# Patient Record
Sex: Female | Born: 1975 | Race: White | Hispanic: No | Marital: Single | Smoking: Never smoker
Health system: Southern US, Community
[De-identification: ages and names within clinical notes are randomized; demographics above are authoritative.]

## PROBLEM LIST (undated history)

## (undated) DIAGNOSIS — K589 Irritable bowel syndrome without diarrhea: Secondary | ICD-10-CM

## (undated) DIAGNOSIS — S0300XA Dislocation of jaw, unspecified side, initial encounter: Secondary | ICD-10-CM

## (undated) DIAGNOSIS — M654 Radial styloid tenosynovitis [de Quervain]: Secondary | ICD-10-CM

## (undated) DIAGNOSIS — J45909 Unspecified asthma, uncomplicated: Secondary | ICD-10-CM

## (undated) DIAGNOSIS — F32A Depression, unspecified: Secondary | ICD-10-CM

## (undated) DIAGNOSIS — F329 Major depressive disorder, single episode, unspecified: Secondary | ICD-10-CM

## (undated) DIAGNOSIS — F419 Anxiety disorder, unspecified: Secondary | ICD-10-CM

## (undated) DIAGNOSIS — D649 Anemia, unspecified: Secondary | ICD-10-CM

## (undated) HISTORY — DX: Depression, unspecified: F32.A

## (undated) HISTORY — DX: Anemia, unspecified: D64.9

## (undated) HISTORY — DX: Irritable bowel syndrome, unspecified: K58.9

## (undated) HISTORY — DX: Dislocation of jaw, unspecified side, initial encounter: S03.00XA

## (undated) HISTORY — DX: Major depressive disorder, single episode, unspecified: F32.9

## (undated) HISTORY — DX: Radial styloid tenosynovitis (de quervain): M65.4

## (undated) HISTORY — DX: Anxiety disorder, unspecified: F41.9

## (undated) HISTORY — DX: Unspecified asthma, uncomplicated: J45.909

---

## 2009-10-23 ENCOUNTER — Ambulatory Visit (HOSPITAL_COMMUNITY): Admission: RE | Admit: 2009-10-23 | Discharge: 2009-10-23 | Payer: Self-pay | Admitting: Family Medicine

## 2009-11-05 ENCOUNTER — Emergency Department (HOSPITAL_COMMUNITY): Admission: EM | Admit: 2009-11-05 | Discharge: 2009-11-06 | Payer: Self-pay | Admitting: Emergency Medicine

## 2009-11-06 ENCOUNTER — Encounter (INDEPENDENT_AMBULATORY_CARE_PROVIDER_SITE_OTHER): Payer: Self-pay | Admitting: Emergency Medicine

## 2009-11-06 ENCOUNTER — Ambulatory Visit: Payer: Self-pay | Admitting: Vascular Surgery

## 2009-11-06 ENCOUNTER — Ambulatory Visit (HOSPITAL_COMMUNITY): Admission: RE | Admit: 2009-11-06 | Discharge: 2009-11-06 | Payer: Self-pay | Admitting: Emergency Medicine

## 2010-02-08 ENCOUNTER — Encounter: Payer: Self-pay | Admitting: Family Medicine

## 2014-04-20 ENCOUNTER — Other Ambulatory Visit: Payer: Self-pay | Admitting: Otolaryngology

## 2014-04-20 DIAGNOSIS — H9191 Unspecified hearing loss, right ear: Secondary | ICD-10-CM

## 2014-04-25 ENCOUNTER — Ambulatory Visit
Admission: RE | Admit: 2014-04-25 | Discharge: 2014-04-25 | Disposition: A | Discharge status: Home / Self Care | Payer: BLUE CROSS/BLUE SHIELD | Source: Ambulatory Visit | Attending: Otolaryngology | Admitting: Otolaryngology

## 2014-04-25 DIAGNOSIS — H9191 Unspecified hearing loss, right ear: Secondary | ICD-10-CM

## 2014-04-25 MED ORDER — GADOBENATE DIMEGLUMINE 529 MG/ML IV SOLN
12.0000 mL | Freq: Once | INTRAVENOUS | Status: AC | PRN
  Administered 2014-04-25: 12 mL via INTRAVENOUS

## 2015-03-24 DIAGNOSIS — F419 Anxiety disorder, unspecified: Secondary | ICD-10-CM | POA: Insufficient documentation

## 2015-03-24 DIAGNOSIS — K589 Irritable bowel syndrome without diarrhea: Secondary | ICD-10-CM | POA: Insufficient documentation

## 2015-03-24 DIAGNOSIS — M654 Radial styloid tenosynovitis [de Quervain]: Secondary | ICD-10-CM | POA: Insufficient documentation

## 2015-03-24 DIAGNOSIS — F339 Major depressive disorder, recurrent, unspecified: Secondary | ICD-10-CM | POA: Insufficient documentation

## 2015-03-24 DIAGNOSIS — J45909 Unspecified asthma, uncomplicated: Secondary | ICD-10-CM | POA: Insufficient documentation

## 2015-03-24 DIAGNOSIS — G4700 Insomnia, unspecified: Secondary | ICD-10-CM | POA: Insufficient documentation

## 2015-03-25 ENCOUNTER — Encounter: Payer: Self-pay | Admitting: Internal Medicine

## 2015-03-27 ENCOUNTER — Encounter: Payer: Self-pay | Admitting: Internal Medicine

## 2015-03-27 ENCOUNTER — Ambulatory Visit (INDEPENDENT_AMBULATORY_CARE_PROVIDER_SITE_OTHER): Payer: BLUE CROSS/BLUE SHIELD | Admitting: Internal Medicine

## 2015-03-27 VITALS — BP 126/60 | HR 62 | Ht 65.0 in | Wt 132.0 lb

## 2015-03-27 DIAGNOSIS — K602 Anal fissure, unspecified: Secondary | ICD-10-CM | POA: Diagnosis not present

## 2015-03-27 DIAGNOSIS — K625 Hemorrhage of anus and rectum: Secondary | ICD-10-CM | POA: Diagnosis not present

## 2015-03-27 DIAGNOSIS — K6289 Other specified diseases of anus and rectum: Secondary | ICD-10-CM

## 2015-03-27 MED ORDER — DILTIAZEM GEL 2 %: 1.0000 "application " | Freq: Three times a day (TID) | CUTANEOUS | Status: DC

## 2015-03-27 NOTE — Progress Notes (Signed)
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 Vonna KotykPasquele for evaluation of rectal pain and rectal bleeding. The patient reports being in her usual state of good health until late January when after passing a hard bowel movement noticed rectal bleeding. This persisted with most bowel movements and was associated with rectal discomfort. More severe pain last week. She describes the discomfort as stinging or tearing. Historically she describes her bowels as generally on the loose side. Occasional constipation when traveling or under stress. She has been using apple cider vinegar which regulates her bowel habits. She denies weight loss, fever, abdominal pain, or family history of Crohn's disease. She does tell me that she had painless [facility] [place] [facility] for which she  underwent an unremarkable colonoscopy. GI review of systems is otherwise negative.  REVIEW OF SYSTEMS:  All non-GI ROS negative except for back pain, hearing problems  Past Medical History  Diagnosis Date  . Anxiety   . Anemia   . Asthma   . TMJ (dislocation of temporomandibular joint)   . IBS (irritable bowel syndrome)   . Depression   . De Quervain's disease (radial styloid tenosynovitis)     History reviewed. No pertinent past surgical history.  Social History Kendra Berger  reports that she has never smoked. She has never used smokeless tobacco. She reports that she drinks about 0.6 oz of alcohol per week. Her drug history is not on file.  family history includes Bladder Cancer in her maternal grandmother; Diabetes in her father.  Allergies  Allergen Reactions  . Prednisone        PHYSICAL EXAMINATION: Vital signs: BP 126/60 mmHg  Pulse 62  Ht 5\' 5"  (1.651 m)  Wt 132 lb (59.875 kg)  BMI 21.97 kg/m2  LMP 03/21/2015 General: Well-developed, well-nourished, no acute distress HEENT: Sclerae are anicteric, conjunctiva pink. Oral  mucosa intact Lungs: Clear Heart: Regular Abdomen: soft, nontender, nondistended, no obvious ascites, no peritoneal signs, normal bowel sounds. No organomegaly. Rectal: No external hemorrhoids. Tender left lateral fissure without heme Extremities: NoClubbing cyanosis or edema Psychiatric: alert and oriented x3. Cooperative   ASSESSMENT:  #1. Asymptomatic anal fissure   PLAN:  #1. Sitz baths daily #2. Daily fiber supplementation with Metamucil or Citrucel #3. 2% diltiazem rectal gel to be applied 3 times daily for 8 weeks #4. If symptomatic after completing diltiazem therapy, contact the office. May need surgical referral  A copy of this consultation has been sent to Dr. Creola Corn Pasquele

## 2015-03-27 NOTE — Patient Instructions (Signed)
We have sent the following medications to your pharmacy for you to pick up at your convenience:  Diltiazem Gel - [facility]  You may use Metamucil - 1-2 tablespoons a day in water or juice  You may use sitz baths as well

## 2015-05-16 DIAGNOSIS — I82492 Acute embolism and thrombosis of other specified deep vein of left lower extremity: Secondary | ICD-10-CM | POA: Diagnosis not present

## 2015-05-16 DIAGNOSIS — F418 Other specified anxiety disorders: Secondary | ICD-10-CM | POA: Diagnosis not present

## 2015-05-16 DIAGNOSIS — R87619 Unspecified abnormal cytological findings in specimens from cervix uteri: Secondary | ICD-10-CM | POA: Diagnosis not present

## 2015-05-16 DIAGNOSIS — R232 Flushing: Secondary | ICD-10-CM | POA: Diagnosis not present

## 2015-10-28 DIAGNOSIS — Z01419 Encounter for gynecological examination (general) (routine) without abnormal findings: Secondary | ICD-10-CM | POA: Diagnosis not present

## 2015-10-28 DIAGNOSIS — Z6821 Body mass index (BMI) 21.0-21.9, adult: Secondary | ICD-10-CM | POA: Diagnosis not present

## 2015-11-05 DIAGNOSIS — Z23 Encounter for immunization: Secondary | ICD-10-CM | POA: Diagnosis not present

## 2015-11-05 DIAGNOSIS — F418 Other specified anxiety disorders: Secondary | ICD-10-CM | POA: Diagnosis not present

## 2015-11-05 DIAGNOSIS — I82492 Acute embolism and thrombosis of other specified deep vein of left lower extremity: Secondary | ICD-10-CM | POA: Diagnosis not present

## 2015-11-05 DIAGNOSIS — Z0000 Encounter for general adult medical examination without abnormal findings: Secondary | ICD-10-CM | POA: Diagnosis not present

## 2015-11-05 DIAGNOSIS — R87619 Unspecified abnormal cytological findings in specimens from cervix uteri: Secondary | ICD-10-CM | POA: Diagnosis not present

## 2016-03-02 DIAGNOSIS — R05 Cough: Secondary | ICD-10-CM | POA: Diagnosis not present

## 2016-03-02 DIAGNOSIS — R509 Fever, unspecified: Secondary | ICD-10-CM | POA: Diagnosis not present

## 2016-03-02 DIAGNOSIS — J111 Influenza due to unidentified influenza virus with other respiratory manifestations: Secondary | ICD-10-CM | POA: Diagnosis not present

## 2016-03-05 ENCOUNTER — Ambulatory Visit
Admission: RE | Admit: 2016-03-05 | Discharge: 2016-03-05 | Disposition: A | Discharge status: Home / Self Care | Payer: BLUE CROSS/BLUE SHIELD | Source: Ambulatory Visit | Attending: Internal Medicine | Admitting: Internal Medicine

## 2016-03-05 ENCOUNTER — Other Ambulatory Visit: Payer: Self-pay | Admitting: Internal Medicine

## 2016-03-05 DIAGNOSIS — R042 Hemoptysis: Secondary | ICD-10-CM

## 2016-03-05 DIAGNOSIS — R0981 Nasal congestion: Secondary | ICD-10-CM | POA: Diagnosis not present

## 2016-03-05 DIAGNOSIS — G4489 Other headache syndrome: Secondary | ICD-10-CM | POA: Diagnosis not present

## 2016-03-11 DIAGNOSIS — R0781 Pleurodynia: Secondary | ICD-10-CM | POA: Diagnosis not present

## 2016-03-11 DIAGNOSIS — J111 Influenza due to unidentified influenza virus with other respiratory manifestations: Secondary | ICD-10-CM | POA: Diagnosis not present

## 2016-03-11 DIAGNOSIS — K5903 Drug induced constipation: Secondary | ICD-10-CM | POA: Diagnosis not present

## 2016-03-11 DIAGNOSIS — R05 Cough: Secondary | ICD-10-CM | POA: Diagnosis not present

## 2016-03-23 DIAGNOSIS — H6691 Otitis media, unspecified, right ear: Secondary | ICD-10-CM | POA: Diagnosis not present

## 2016-07-09 DIAGNOSIS — D2262 Melanocytic nevi of left upper limb, including shoulder: Secondary | ICD-10-CM | POA: Diagnosis not present

## 2016-07-09 DIAGNOSIS — D2261 Melanocytic nevi of right upper limb, including shoulder: Secondary | ICD-10-CM | POA: Diagnosis not present

## 2016-07-09 DIAGNOSIS — D2272 Melanocytic nevi of left lower limb, including hip: Secondary | ICD-10-CM | POA: Diagnosis not present

## 2016-07-09 DIAGNOSIS — D225 Melanocytic nevi of trunk: Secondary | ICD-10-CM | POA: Diagnosis not present

## 2016-11-19 DIAGNOSIS — Z23 Encounter for immunization: Secondary | ICD-10-CM | POA: Diagnosis not present

## 2017-01-20 DIAGNOSIS — H00025 Hordeolum internum left lower eyelid: Secondary | ICD-10-CM | POA: Diagnosis not present

## 2017-03-02 ENCOUNTER — Ambulatory Visit
Admission: RE | Admit: 2017-03-02 | Discharge: 2017-03-02 | Disposition: A | Discharge status: Home / Self Care | Payer: BLUE CROSS/BLUE SHIELD | Source: Ambulatory Visit | Attending: Internal Medicine | Admitting: Internal Medicine

## 2017-03-02 ENCOUNTER — Other Ambulatory Visit: Payer: Self-pay | Admitting: Internal Medicine

## 2017-03-02 DIAGNOSIS — S79912A Unspecified injury of left hip, initial encounter: Secondary | ICD-10-CM | POA: Diagnosis not present

## 2017-03-02 DIAGNOSIS — S3993XA Unspecified injury of pelvis, initial encounter: Secondary | ICD-10-CM | POA: Diagnosis not present

## 2017-03-02 DIAGNOSIS — R202 Paresthesia of skin: Secondary | ICD-10-CM | POA: Diagnosis not present

## 2017-03-02 DIAGNOSIS — M25552 Pain in left hip: Secondary | ICD-10-CM | POA: Diagnosis not present

## 2017-03-11 DIAGNOSIS — R6882 Decreased libido: Secondary | ICD-10-CM | POA: Diagnosis not present

## 2017-03-11 DIAGNOSIS — Z01411 Encounter for gynecological examination (general) (routine) with abnormal findings: Secondary | ICD-10-CM | POA: Diagnosis not present

## 2017-03-11 DIAGNOSIS — N939 Abnormal uterine and vaginal bleeding, unspecified: Secondary | ICD-10-CM | POA: Diagnosis not present

## 2017-03-11 DIAGNOSIS — Z86718 Personal history of other venous thrombosis and embolism: Secondary | ICD-10-CM | POA: Diagnosis not present

## 2017-03-15 DIAGNOSIS — N939 Abnormal uterine and vaginal bleeding, unspecified: Secondary | ICD-10-CM | POA: Diagnosis not present

## 2017-03-24 DIAGNOSIS — N938 Other specified abnormal uterine and vaginal bleeding: Secondary | ICD-10-CM | POA: Diagnosis not present

## 2017-03-24 DIAGNOSIS — D260 Other benign neoplasm of cervix uteri: Secondary | ICD-10-CM | POA: Diagnosis not present

## 2017-03-24 DIAGNOSIS — Z3202 Encounter for pregnancy test, result negative: Secondary | ICD-10-CM | POA: Diagnosis not present

## 2017-03-29 DIAGNOSIS — T148XXA Other injury of unspecified body region, initial encounter: Secondary | ICD-10-CM | POA: Diagnosis not present

## 2017-03-29 DIAGNOSIS — Z13220 Encounter for screening for lipoid disorders: Secondary | ICD-10-CM | POA: Diagnosis not present

## 2017-03-29 DIAGNOSIS — E559 Vitamin D deficiency, unspecified: Secondary | ICD-10-CM | POA: Diagnosis not present

## 2017-03-29 DIAGNOSIS — Z0000 Encounter for general adult medical examination without abnormal findings: Secondary | ICD-10-CM | POA: Diagnosis not present

## 2017-03-31 ENCOUNTER — Encounter: Payer: Self-pay | Admitting: Hematology and Oncology

## 2017-03-31 ENCOUNTER — Telehealth: Payer: Self-pay | Admitting: Hematology and Oncology

## 2017-03-31 NOTE — Telephone Encounter (Signed)
Appt has been scheduled for the pt to see Dr. Gweneth DimitriPerlov on 4/22 at 11am. Pt aware to arrive 30 minutes early. Letter mailed and faxed to the referring.

## 2017-05-10 ENCOUNTER — Inpatient Hospital Stay: Payer: BLUE CROSS/BLUE SHIELD | Attending: Hematology and Oncology | Admitting: Hematology and Oncology

## 2017-05-10 ENCOUNTER — Telehealth: Payer: Self-pay

## 2017-05-10 ENCOUNTER — Encounter: Payer: Self-pay | Admitting: Hematology and Oncology

## 2017-05-10 ENCOUNTER — Inpatient Hospital Stay: Payer: BLUE CROSS/BLUE SHIELD

## 2017-05-10 VITALS — BP 123/68 | HR 51 | Temp 98.2°F | Resp 17 | Ht 65.0 in | Wt 135.0 lb

## 2017-05-10 DIAGNOSIS — Z86718 Personal history of other venous thrombosis and embolism: Secondary | ICD-10-CM

## 2017-05-10 DIAGNOSIS — I7300 Raynaud's syndrome without gangrene: Secondary | ICD-10-CM

## 2017-05-10 DIAGNOSIS — F329 Major depressive disorder, single episode, unspecified: Secondary | ICD-10-CM | POA: Insufficient documentation

## 2017-05-10 DIAGNOSIS — F419 Anxiety disorder, unspecified: Secondary | ICD-10-CM | POA: Diagnosis not present

## 2017-05-10 DIAGNOSIS — Z79899 Other long term (current) drug therapy: Secondary | ICD-10-CM | POA: Insufficient documentation

## 2017-05-10 LAB — CBC WITH DIFFERENTIAL (CANCER CENTER ONLY)
Basophils Absolute: 0 10*3/uL (ref 0.0–0.1)
Basophils Relative: 1 %
EOS PCT: 1 %
Eosinophils Absolute: 0 10*3/uL (ref 0.0–0.5)
HCT: 42.2 % (ref 34.8–46.6)
Hemoglobin: 14 g/dL (ref 11.6–15.9)
LYMPHS ABS: 1.6 10*3/uL (ref 0.9–3.3)
LYMPHS PCT: 23 %
MCH: 28 pg (ref 25.1–34.0)
MCHC: 33.1 g/dL (ref 31.5–36.0)
MCV: 84.8 fL (ref 79.5–101.0)
MONOS PCT: 5 %
Monocytes Absolute: 0.4 10*3/uL (ref 0.1–0.9)
Neutro Abs: 5 10*3/uL (ref 1.5–6.5)
Neutrophils Relative %: 70 %
PLATELETS: 245 10*3/uL (ref 145–400)
RBC: 4.98 MIL/uL (ref 3.70–5.45)
RDW: 13.6 % (ref 11.2–14.5)
WBC: 7.1 10*3/uL (ref 3.9–10.3)

## 2017-05-10 LAB — CMP (CANCER CENTER ONLY)
ALT: 16 U/L (ref 0–55)
AST: 20 U/L (ref 5–34)
Albumin: 4.3 g/dL (ref 3.5–5.0)
Alkaline Phosphatase: 52 U/L (ref 40–150)
Anion gap: 10 (ref 3–11)
BUN: 10 mg/dL (ref 7–26)
CHLORIDE: 105 mmol/L (ref 98–109)
CO2: 23 mmol/L (ref 22–29)
CREATININE: 0.8 mg/dL (ref 0.60–1.10)
Calcium: 9.3 mg/dL (ref 8.4–10.4)
Glucose, Bld: 83 mg/dL (ref 70–140)
Potassium: 4.3 mmol/L (ref 3.5–5.1)
Sodium: 138 mmol/L (ref 136–145)
Total Bilirubin: 0.5 mg/dL (ref 0.2–1.2)
Total Protein: 7.1 g/dL (ref 6.4–8.3)

## 2017-05-10 NOTE — Telephone Encounter (Signed)
Printed avs and calender of upcoming appointment. Per 4/22 los 

## 2017-05-11 LAB — RHEUMATOID FACTOR: Rhuematoid fact SerPl-aCnc: <10 IU/mL (ref 0.0–13.9)

## 2017-05-11 LAB — ANCA TITERS: C-ANCA: <1:20 {titer}

## 2017-05-11 LAB — ANTINUCLEAR ANTIBODIES, IFA: ANTINUCLEAR ANTIBODIES, IFA: NEGATIVE

## 2017-05-11 LAB — LUPUS ANTICOAGULANT PANEL
DRVVT: 34.1 s (ref 0.0–47.0)
PTT Lupus Anticoagulant: 41.6 s (ref 0.0–51.9)

## 2017-05-17 ENCOUNTER — Inpatient Hospital Stay (HOSPITAL_BASED_OUTPATIENT_CLINIC_OR_DEPARTMENT_OTHER): Payer: BLUE CROSS/BLUE SHIELD | Admitting: Hematology and Oncology

## 2017-05-17 VITALS — BP 114/71 | HR 51 | Temp 98.7°F | Resp 17 | Ht 65.0 in | Wt 134.9 lb

## 2017-05-17 DIAGNOSIS — Z86718 Personal history of other venous thrombosis and embolism: Secondary | ICD-10-CM

## 2017-05-17 DIAGNOSIS — I7300 Raynaud's syndrome without gangrene: Secondary | ICD-10-CM | POA: Insufficient documentation

## 2017-05-17 NOTE — Progress Notes (Signed)
[name]:  Assessment: History of DVT of lower extremity 42 y.o. otherwise healthy female with history of minimal burden provoked deep vein thrombosis in the left lower extremity in 2011 treated with a short course of therapeutic anticoagulation.  Reported history of positive lupus anticoagulant although I am unable to find the lab value confirming presence of lupus anticoagulant on review of the available data from our facility, [facility], or documents provided with the referral.  Patient has had no recurrent thrombosis.  She had another trauma to her left lower extremity suffered during fall in the February 2019 with persistent symptoms of tingling, numbness and skin discoloration with persistent tenderness and discomfort at the lateral aspect of ilial crest.  This is likely due to previous hematoma in the soft tissues of the lateral gluteal and anterior thigh compartments with soft tissue injury that is taken time to recover.  Additionally, patient has symptoms of cold-associated discoloration and skin abnormalities consistent with Raynaud's syndrome.  Her brother has similar symptoms.  No previous history of malar rash, photosensitivity, oral ulcers, or any other signs or symptoms to suggest systemic or cutaneous lupus.  Plan: - Labs today as outlined below. Return to clinic in 1 week to review the findings.   Voice recognition software was used and creation of this note. Despite my best effort at editing the text, some misspelling/errors may have occurred. Orders Placed This Encounter  Procedures  . CBC with Differential (Cancer Center Only)    Standing Status:   Future    Number of Occurrences:   1    Standing Expiration Date:   05/11/2018  . CMP ([facility] only)    Standing Status:   Future    Number of Occurrences:   1    Standing Expiration Date:   05/11/2018  . Lupus anticoagulant panel*    Standing Status:   Future    Number of Occurrences:    1    Standing Expiration Date:   05/10/2018  . ANA, IFA (with reflex)    Standing Status:   Future    Number of Occurrences:   1    Standing Expiration Date:   05/10/2018  . Rheumatoid factor    Standing Status:   Future    Number of Occurrences:   1    Standing Expiration Date:   05/10/2018  . ANCA Titers    Standing Status:   Future    Number of Occurrences:   1    Standing Expiration Date:   05/10/2018    All questions were answered. . The patient knows to call the clinic with any problems, questions or concerns.  This note was electronically signed.    History of Presenting Illness Kendra Berger 42 y.o. presenting to the [facility] for history of deep vein thrombosis and positive DRV VT test, referred by Dr Lanice Shirts.  Patient's past medical history significant for anxiety/depression and no other chronic medical conditions.  Family history significant for maternal grandmother with bladder cancer and maternal great aunt with breast cancer in her late 69s.  Patient does not smoke, does not use smokeless tobacco and drinks approximately 1 alcoholic beverage per week.  At the present time, patient denies any fevers, chills, night sweats.  Patient had a fall in February 2019 with pain on the left lateral thigh with associated bruising.  Continues to have significant amount of discomfort associated with numbness and tingling in the same area  despite resolution of the ecchymosis.  No distal extremity weakness, no neurological deficit and sensation or strength in the distal extremity.  Patient also complains of hands turning pale and occasionally developing blisters when exposed to cold followed by flushing red and uncomfortable when rewarmed.  Brother has similar condition.  Patient denies any photosensitivity, oral ulcers, skin rashes, swollen lymph nodes in the neck, armpits, or groin.  Denies difficulty swallowing, dysphasia, nausea, vomiting, abdominal pain, diarrhea, or  constipation.  No significant pain in the joints.  Oncological/hematological History: -- Event #1, Oct 2011: Presented to the ER with pain in the right lower extremity in the knee and the leg.  Examination revealed swelling and tenderness in the left calf and the popliteal area.  --Doppler US LtLE, 11/07/09: Acute deep vein thrombosis in the left lower extremity involving left peroneal vein  --Treatment: enoxaparin --> warfarin x1.5-32mo(per pt)  Medical History: Past Medical History:  Diagnosis Date  . Anemia   . Anxiety   . Asthma   . De Quervain's disease (radial styloid tenosynovitis)   . Depression   . IBS (irritable bowel syndrome)   . TMJ (dislocation of temporomandibular joint)     Surgical History: History reviewed. No pertinent surgical history.  Family History: Family History  Problem Relation Age of Onset  . Diabetes Father        several relatives on fathers side  . Bladder Cancer Maternal Grandmother     Social History: Social History   Socioeconomic History  . Marital status: Single    Spouse name: Not on file  . Number of children: 0  . Years of education: Not on file  . Highest education level: Not on file  Occupational History  . Occupation: aSurveyor, minerals Social Needs  . Financial resource strain: Not on file  . Food insecurity:    Worry: Not on file    Inability: Not on file  . Transportation needs:    Medical: Not on file    Non-medical: Not on file  Tobacco Use  . Smoking status: Never Smoker  . Smokeless tobacco: Never Used  Substance and Sexual Activity  . Alcohol use: Yes    Alcohol/week: 0.6 oz    Types: 1 Standard drinks or equivalent per week  . Drug use: Not on file  . Sexual activity: Not on file  Lifestyle  . Physical activity:    Days per week: Not on file    Minutes per session: Not on file  . Stress: Not on file  Relationships  . Social connections:    Talks on phone: Not on file    Gets together: Not on file     Attends religious service: Not on file    Active member of club or organization: Not on file    Attends meetings of clubs or organizations: Not on file    Relationship status: Not on file  . Intimate partner violence:    Fear of current or ex partner: Not on file    Emotionally abused: Not on file    Physically abused: Not on file    Forced sexual activity: Not on file  Other Topics Concern  . Not on file  Social History Narrative  . Not on file    Allergies: Allergies  Allergen Reactions  . Prednisone     Medications:  Current Outpatient Medications  Medication Sig Dispense Refill  . buPROPion (WELLBUTRIN XL) 150 MG 24 hr tablet Take 150 mg by mouth daily.    .Marland Kitchen  cholecalciferol (VITAMIN D) 1000 units tablet Take 1,000 Units by mouth daily.     No current facility-administered medications for this visit.     Review of Systems: Review of Systems  Musculoskeletal: Positive for arthralgias.  Neurological: Positive for numbness.  All other systems reviewed and are negative.    PHYSICAL EXAMINATION Blood pressure 123/68, pulse (!) 51, temperature 98.2 F (36.8 C), temperature source Oral, resp. rate 17, height '5\' 5"'$  (1.651 m), weight 135 lb (61.2 kg), SpO2 100 %.  ECOG PERFORMANCE STATUS: 1 - Symptomatic but completely ambulatory  Physical Exam  Constitutional: She is oriented to person, place, and time. She appears well-developed and well-nourished. No distress.  HENT:  Head: Normocephalic and atraumatic.  Mouth/Throat: Oropharynx is clear and moist. No oropharyngeal exudate.  Eyes: Pupils are equal, round, and reactive to light. Conjunctivae and EOM are normal. No scleral icterus.  Neck: No thyromegaly present.  Cardiovascular: Normal rate, regular rhythm and normal heart sounds. Exam reveals no gallop and no friction rub.  No murmur heard. Pulmonary/Chest: Effort normal and breath sounds normal. No stridor. No respiratory distress. She has no wheezes.  Abdominal:  Soft. Bowel sounds are normal. She exhibits no distension. There is no tenderness. There is no guarding.  Musculoskeletal: She exhibits no edema.  Lymphadenopathy:    She has no cervical adenopathy.  Neurological: She is alert and oriented to person, place, and time. She displays normal reflexes. No cranial nerve deficit or sensory deficit.  Skin: Skin is warm and dry. No rash noted. She is not diaphoretic. No erythema. No pallor.     LABORATORY DATA: I have personally reviewed the data as listed: Appointment on 05/10/2017  Component Date Value Ref Range Status  . WBC Count 05/10/2017 7.1  3.9 - 10.3 K/uL Final  . RBC 05/10/2017 4.98  3.70 - 5.45 MIL/uL Final  . Hemoglobin 05/10/2017 14.0  11.6 - 15.9 g/dL Final  . HCT 05/10/2017 42.2  34.8 - 46.6 % Final  . MCV 05/10/2017 84.8  79.5 - 101.0 fL Final  . MCH 05/10/2017 28.0  25.1 - 34.0 pg Final  . MCHC 05/10/2017 33.1  31.5 - 36.0 g/dL Final  . RDW 05/10/2017 13.6  11.2 - 14.5 % Final  . Platelet Count 05/10/2017 245  145 - 400 K/uL Final  . Neutrophils Relative % 05/10/2017 70  % Final  . Neutro Abs 05/10/2017 5.0  1.5 - 6.5 K/uL Final  . Lymphocytes Relative 05/10/2017 23  % Final  . Lymphs Abs 05/10/2017 1.6  0.9 - 3.3 K/uL Final  . Monocytes Relative 05/10/2017 5  % Final  . Monocytes Absolute 05/10/2017 0.4  0.1 - 0.9 K/uL Final  . Eosinophils Relative 05/10/2017 1  % Final  . Eosinophils Absolute 05/10/2017 0.0  0.0 - 0.5 K/uL Final  . Basophils Relative 05/10/2017 1  % Final  . Basophils Absolute 05/10/2017 0.0  0.0 - 0.1 K/uL Final   Performed at [facility], [place] [address], [place], [place] [address]  . Sodium 05/10/2017 138  136 - 145 mmol/L Final  . Potassium 05/10/2017 4.3  3.5 - 5.1 mmol/L Final  . Chloride 05/10/2017 105  98 - 109 mmol/L Final  . CO2 05/10/2017 23  22 - 29 mmol/L Final  . Glucose, Bld 05/10/2017 83  70 - 140 mg/dL Final  . BUN 05/10/2017 10  7 - 26 mg/dL Final  .  Creatinine 05/10/2017 0.80  0.60 - 1.10 mg/dL Final  . Calcium  05/10/2017 9.3  8.4 - 10.4 mg/dL Final  . Total Protein 05/10/2017 7.1  6.4 - 8.3 g/dL Final  . Albumin 05/10/2017 4.3  3.5 - 5.0 g/dL Final  . AST 05/10/2017 20  5 - 34 U/L Final  . ALT 05/10/2017 16  0 - 55 U/L Final  . Alkaline Phosphatase 05/10/2017 52  40 - 150 U/L Final  . Total Bilirubin 05/10/2017 0.5  0.2 - 1.2 mg/dL Final  . GFR, Est Non Af Am 05/10/2017 >60  >60 mL/min Final  . GFR, Est AFR Am 05/10/2017 >60  >60 mL/min Final   Comment: (NOTE) The eGFR has been calculated using the CKD EPI equation. This calculation has not been validated in all clinical situations. eGFR's persistently <60 mL/min signify possible Chronic Kidney Disease.   Georgiann Hahn gap 05/10/2017 10  3 - 11 Final   Performed at [facility], [place] [address], [place], [place] [address]  . PTT Lupus Anticoagulant 05/10/2017 41.6  0.0 - 51.9 sec Final  . DRVVT 05/10/2017 34.1  0.0 - 47.0 sec Final  . Lupus Anticoag Interp 05/10/2017 Comment:   Corrected   Comment: (NOTE) No lupus anticoagulant was detected. Performed At: [facility] [place], [place] [id number] Rush Farmer MD [id number] Performed at [facility], [place] [address] [place], [place] [address]   . ANA Ab, IFA 05/10/2017 Negative   Final   Comment: (NOTE)                                     Negative   <1:80                                     Borderline  1:80                                     Positive   >1:80 Performed At: [facility] [place], [place] [id number] Rush Farmer MD [id number] Performed at [facility], [place] [address] [place], [place] [address]   . Rhuematoid fact SerPl-aCnc 05/10/2017 <10.0  0.0 - 13.9 IU/mL Final   Comment: (NOTE) Performed At: [facility] [place], [place] [id number] Rush Farmer MD  [id number] Performed at [facility], [place] [address] [place], [place] [address]   . C-ANCA 05/10/2017 <1:20  Neg:<1:20 titer Final  . P-ANCA 05/10/2017 <1:20  Neg:<1:20 titer Final   Comment: (NOTE) The presence of positive fluorescence exhibiting P-ANCA or C-ANCA patterns alone is not specific for the diagnosis of Wegener's Granulomatosis (WG) or microscopic polyangiitis. Decisions about treatment should not be based solely on ANCA IFA results.  The International ANCA Group Consensus recommends follow up testing of positive sera with both PR-3 and MPO-ANCA enzyme immunoassays. As many as 5% serum samples are positive only by EIA. Ref. AM J Clin Pathol 1999;111:507-513.   Marland Kitchen Atypical P-ANCA titer 05/10/2017 <1:20  Neg:<1:20 titer Final   Comment: (NOTE) The atypical pANCA pattern has been observed in a significant percentage of patients with ulcerative colitis, primary sclerosing cholangitis and autoimmune hepatitis. Performed At: [facility] [place], [place] [id number] Rush Farmer MD [id number] Performed at [facility], [place] [address], [place], [place] [address]          Ardath Sax, MD

## 2017-05-17 NOTE — Assessment & Plan Note (Addendum)
42 y.o. otherwise healthy female with history of minimal burden provoked deep vein thrombosis in the left lower extremity in 2011 treated with a short course of therapeutic anticoagulation.  Reported history of positive lupus anticoagulant although I am unable to find the lab value confirming presence of lupus anticoagulant on review of the available data from our facility, [facility], or documents provided with the referral.  Patient has had no recurrent thrombosis.  She had another trauma to her left lower extremity suffered during fall in the February 2019 with persistent symptoms of tingling, numbness and skin discoloration with persistent tenderness and discomfort at the lateral aspect of ilial crest.  This is likely due to previous hematoma in the soft tissues of the lateral gluteal and anterior thigh compartments with soft tissue injury that is taken time to recover.  Additionally, patient has symptoms of cold-associated discoloration and skin abnormalities consistent with Raynaud's syndrome.  Her brother has similar symptoms.  No previous history of malar rash, photosensitivity, oral ulcers, or any other signs or symptoms to suggest systemic or cutaneous lupus.  Plan: - Labs today as outlined below. Return to clinic in 1 week to review the findings.

## 2017-05-19 ENCOUNTER — Telehealth: Payer: Self-pay | Admitting: Hematology and Oncology

## 2017-05-19 NOTE — Telephone Encounter (Signed)
No LOS 4/29 °

## 2017-06-06 NOTE — Assessment & Plan Note (Addendum)
42 y.o. otherwise healthy female with history of minimal burden provoked deep vein thrombosis in the left lower extremity in 2011 treated with a short course of therapeutic anticoagulation.  Reported history of positive lupus anticoagulant although I am unable to find the lab value confirming presence of lupus anticoagulant on review of the available data from our facility, [facility], or documents provided with the referral.  Patient has had no recurrent thrombosis.  She had another trauma to her left lower extremity suffered during fall in the February 2019 with persistent symptoms of tingling, numbness and skin discoloration with persistent tenderness and discomfort at the lateral aspect of ilial crest.  This is likely due to previous hematoma in the soft tissues of the lateral gluteal and anterior thigh compartments with soft tissue injury that is taken time to recover.  Additionally, patient has symptoms of cold-associated discoloration and skin abnormalities consistent with Raynaud's syndrome.  Her brother has similar symptoms.  No previous history of malar rash, photosensitivity, oral ulcers, or any other signs or symptoms to suggest systemic or cutaneous lupus.  Additional lab work obtained during the last visit to the clinic demonstrates no evidence of antiphospholipid antibody syndrome.  Patient tested negative for serological markers of autoimmune conditions at this time.  Plan: - No need for additional investigations at this point time and absence of new symptoms.  No need to restart anticoagulation at this time. - Recommend primary care provider to consider initiation of calcium channel blocker for possible Raynaud's syndrome if deemed appropriate . -Return to our clinic as needed.

## 2017-06-06 NOTE — Progress Notes (Signed)
[facility] Follow-up Visit:  Assessment: History of DVT of lower extremity 42 y.o. otherwise healthy female with history of minimal burden provoked deep vein thrombosis in the left lower extremity in 2011 treated with a short course of therapeutic anticoagulation.  Reported history of positive lupus anticoagulant although I am unable to find the lab value confirming presence of lupus anticoagulant on review of the available data from our facility, [facility], or documents provided with the referral.  Patient has had no recurrent thrombosis.  She had another trauma to her left lower extremity suffered during fall in the February 2019 with persistent symptoms of tingling, numbness and skin discoloration with persistent tenderness and discomfort at the lateral aspect of ilial crest.  This is likely due to previous hematoma in the soft tissues of the lateral gluteal and anterior thigh compartments with soft tissue injury that is taken time to recover.  Additionally, patient has symptoms of cold-associated discoloration and skin abnormalities consistent with Raynaud's syndrome.  Her brother has similar symptoms.  No previous history of malar rash, photosensitivity, oral ulcers, or any other signs or symptoms to suggest systemic or cutaneous lupus.  Additional lab work obtained during the last visit to the clinic demonstrates no evidence of antiphospholipid antibody syndrome.  Patient tested negative for serological markers of autoimmune conditions at this time.  Plan: - No need for additional investigations at this point time and absence of new symptoms.  No need to restart anticoagulation at this time. - Recommend primary care provider to consider initiation of calcium channel blocker for possible Raynaud's syndrome if deemed appropriate . -Return to our clinic as needed.   Voice recognition software was used and creation of this note. Despite my best effort at editing the text,  some misspelling/errors may have occurred.  No orders of the defined types were placed in this encounter.   Cancer Staging No matching staging information was found for the patient.  All questions were answered.  . The patient knows to call the clinic with any problems, questions or concerns.  This note was electronically signed.    History of Presenting Illness Kendra Berger is a 42 y.o. female followed in the [facility] for history of deep vein thrombosis and positive DRV VT test, referred by Dr Lanice Shirts.  Patient's past medical history significant for anxiety/depression and no other chronic medical conditions.  Family history significant for maternal grandmother with bladder cancer and maternal great aunt with breast cancer in her late 10s.  Patient does not smoke, does not use smokeless tobacco and drinks approximately 1 alcoholic beverage per week.  At the present time, patient denies any fevers, chills, night sweats.  Patient had a fall in February 2019 with pain on the left lateral thigh with associated bruising.  Continues to have significant amount of discomfort associated with numbness and tingling in the same area despite resolution of the ecchymosis.  No distal extremity weakness, no neurological deficit and sensation or strength in the distal extremity.  Patient also complains of hands turning pale and occasionally developing blisters when exposed to cold followed by flushing red and uncomfortable when rewarmed.  Brother has similar condition.  Patient denies any photosensitivity, oral ulcers, skin rashes, swollen lymph nodes in the neck, armpits, or groin.  Denies difficulty swallowing, dysphasia, nausea, vomiting, abdominal pain, diarrhea, or constipation.  No significant pain in the joints.  Oncological/hematological History: -- Event #1, Oct 2011: Presented to the ER with pain in  the right lower extremity in the knee and the leg.  Examination revealed  swelling and tenderness in the left calf and the popliteal area.             --Doppler US LtLE, 11/07/09: Acute deep vein thrombosis in the left lower extremity involving left peroneal vein             --Treatment: enoxaparin --> warfarin x1.5-78mo(per pt)   No history exists.    Medical History: Past Medical History:  Diagnosis Date  . Anemia   . Anxiety   . Asthma   . De Quervain's disease (radial styloid tenosynovitis)   . Depression   . IBS (irritable bowel syndrome)   . TMJ (dislocation of temporomandibular joint)     Surgical History: No past surgical history on file.  Family History: Family History  Problem Relation Age of Onset  . Diabetes Father        several relatives on fathers side  . Bladder Cancer Maternal Grandmother     Social History: Social History   Socioeconomic History  . Marital status: Single    Spouse name: Not on file  . Number of children: 0  . Years of education: Not on file  . Highest education level: Not on file  Occupational History  . Occupation: aSurveyor, minerals Social Needs  . Financial resource strain: Not on file  . Food insecurity:    Worry: Not on file    Inability: Not on file  . Transportation needs:    Medical: Not on file    Non-medical: Not on file  Tobacco Use  . Smoking status: Never Smoker  . Smokeless tobacco: Never Used  Substance and Sexual Activity  . Alcohol use: Yes    Alcohol/week: 0.6 oz    Types: 1 Standard drinks or equivalent per week  . Drug use: Not on file  . Sexual activity: Not on file  Lifestyle  . Physical activity:    Days per week: Not on file    Minutes per session: Not on file  . Stress: Not on file  Relationships  . Social connections:    Talks on phone: Not on file    Gets together: Not on file    Attends religious service: Not on file    Active member of club or organization: Not on file    Attends meetings of clubs or organizations: Not on file    Relationship status: Not on file   . Intimate partner violence:    Fear of current or ex partner: Not on file    Emotionally abused: Not on file    Physically abused: Not on file    Forced sexual activity: Not on file  Other Topics Concern  . Not on file  Social History Narrative  . Not on file    Allergies: Allergies  Allergen Reactions  . Prednisone     Medications:  Current Outpatient Medications  Medication Sig Dispense Refill  . buPROPion (WELLBUTRIN XL) 150 MG 24 hr tablet Take 150 mg by mouth daily.    . cholecalciferol (VITAMIN D) 1000 units tablet Take 1,000 Units by mouth daily.     No current facility-administered medications for this visit.     Review of Systems: Review of Systems  Musculoskeletal: Positive for arthralgias.  Neurological: Positive for numbness.  All other systems reviewed and are negative.    PHYSICAL EXAMINATION Blood pressure 114/71, pulse (!) 51, temperature 98.7 F (37.1 C), temperature source  Oral, resp. rate 17, height _0  (1.651 m), weight 134 lb 14.4 oz (61.2 kg), SpO2 100 %.  ECOG PERFORMANCE STATUS: 0 - Asymptomatic  Physical Exam  Constitutional: She is oriented to person, place, and time. She appears well-developed and well-nourished. No distress.  HENT:  Head: Normocephalic and atraumatic.  Mouth/Throat: Oropharynx is clear and moist. No oropharyngeal exudate.  Eyes: Pupils are equal, round, and reactive to light. Conjunctivae and EOM are normal. No scleral icterus.  Neck: No thyromegaly present.  Cardiovascular: Normal rate, regular rhythm and normal heart sounds. Exam reveals no gallop and no friction rub.  No murmur heard. Pulmonary/Chest: Effort normal and breath sounds normal. No stridor. No respiratory distress. She has no wheezes.  Abdominal: Soft. Bowel sounds are normal. She exhibits no distension. There is no tenderness. There is no guarding.  Musculoskeletal: She exhibits no edema.  Lymphadenopathy:    She has no cervical adenopathy.   Neurological: She is alert and oriented to person, place, and time. She displays normal reflexes. No cranial nerve deficit or sensory deficit.  Skin: Skin is warm and dry. No rash noted. She is not diaphoretic. No erythema. No pallor.     LABORATORY DATA: I have personally reviewed the data as listed: No visits with results within 1 Week(s) from this visit.  Latest known visit with results is:  Appointment on 05/10/2017  Component Date Value Ref Range Status  . WBC Count 05/10/2017 7.1  3.9 - 10.3 K/uL Final  . RBC 05/10/2017 4.98  3.70 - 5.45 MIL/uL Final  . Hemoglobin 05/10/2017 14.0  11.6 - 15.9 g/dL Final  . HCT 05/10/2017 42.2  34.8 - 46.6 % Final  . MCV 05/10/2017 84.8  79.5 - 101.0 fL Final  . MCH 05/10/2017 28.0  25.1 - 34.0 pg Final  . MCHC 05/10/2017 33.1  31.5 - 36.0 g/dL Final  . RDW 05/10/2017 13.6  11.2 - 14.5 % Final  . Platelet Count 05/10/2017 245  145 - 400 K/uL Final  . Neutrophils Relative % 05/10/2017 70  % Final  . Neutro Abs 05/10/2017 5.0  1.5 - 6.5 K/uL Final  . Lymphocytes Relative 05/10/2017 23  % Final  . Lymphs Abs 05/10/2017 1.6  0.9 - 3.3 K/uL Final  . Monocytes Relative 05/10/2017 5  % Final  . Monocytes Absolute 05/10/2017 0.4  0.1 - 0.9 K/uL Final  . Eosinophils Relative 05/10/2017 1  % Final  . Eosinophils Absolute 05/10/2017 0.0  0.0 - 0.5 K/uL Final  . Basophils Relative 05/10/2017 1  % Final  . Basophils Absolute 05/10/2017 0.0  0.0 - 0.1 K/uL Final   Performed at [facility], [place] [address] [place], [place] [address]  . Sodium 05/10/2017 138  136 - 145 mmol/L Final  . Potassium 05/10/2017 4.3  3.5 - 5.1 mmol/L Final  . Chloride 05/10/2017 105  98 - 109 mmol/L Final  . CO2 05/10/2017 23  22 - 29 mmol/L Final  . Glucose, Bld 05/10/2017 83  70 - 140 mg/dL Final  . BUN 05/10/2017 10  7 - 26 mg/dL Final  . Creatinine 05/10/2017 0.80  0.60 - 1.10 mg/dL Final  . Calcium 05/10/2017 9.3  8.4 - 10.4 mg/dL Final  . Total  Protein 05/10/2017 7.1  6.4 - 8.3 g/dL Final  . Albumin 05/10/2017 4.3  3.5 - 5.0 g/dL Final  . AST 05/10/2017 20  5 - 34 U/L Final  . ALT 05/10/2017 16  0 - 55 U/L  Final  . Alkaline Phosphatase 05/10/2017 52  40 - 150 U/L Final  . Total Bilirubin 05/10/2017 0.5  0.2 - 1.2 mg/dL Final  . GFR, Est Non Af Am 05/10/2017 >60  >60 mL/min Final  . GFR, Est AFR Am 05/10/2017 >60  >60 mL/min Final   Comment: (NOTE) The eGFR has been calculated using the CKD EPI equation. This calculation has not been validated in all clinical situations. eGFR's persistently <60 mL/min signify possible Chronic Kidney Disease.   Georgiann Hahn gap 05/10/2017 10  3 - 11 Final   Performed at [facility], [place] [address] [place], [place] [address]  . PTT Lupus Anticoagulant 05/10/2017 41.6  0.0 - 51.9 sec Final  . DRVVT 05/10/2017 34.1  0.0 - 47.0 sec Final  . Lupus Anticoag Interp 05/10/2017 Comment:   Corrected   Comment: (NOTE) No lupus anticoagulant was detected. Performed At: [facility] [place], [place] [id number] Rush Farmer MD [id number] Performed at [facility], [place] [address], [place], [place] [address]   . ANA Ab, IFA 05/10/2017 Negative   Final   Comment: (NOTE)                                     Negative   <1:80                                     Borderline  1:80                                     Positive   >1:80 Performed At: [facility] [place], [place] [id number] Rush Farmer MD [id number] Performed at [facility], [place] [address] [place], [place] [address]   . Rhuematoid fact SerPl-aCnc 05/10/2017 <10.0  0.0 - 13.9 IU/mL Final   Comment: (NOTE) Performed At: [facility] [place], [place] [id number] Rush Farmer MD [id number] Performed at [facility], [place] [address] [place], [place] [address]   .  C-ANCA 05/10/2017 <1:20  Neg:<1:20 titer Final  . P-ANCA 05/10/2017 <1:20  Neg:<1:20 titer Final   Comment: (NOTE) The presence of positive fluorescence exhibiting P-ANCA or C-ANCA patterns alone is not specific for the diagnosis of Wegener's Granulomatosis (WG) or microscopic polyangiitis. Decisions about treatment should not be based solely on ANCA IFA results.  The International ANCA Group Consensus recommends follow up testing of positive sera with both PR-3 and MPO-ANCA enzyme immunoassays. As many as 5% serum samples are positive only by EIA. Ref. AM J Clin Pathol 1999;111:507-513.   Marland Kitchen Atypical P-ANCA titer 05/10/2017 <1:20  Neg:<1:20 titer Final   Comment: (NOTE) The atypical pANCA pattern has been observed in a significant percentage of patients with ulcerative colitis, primary sclerosing cholangitis and autoimmune hepatitis. Performed At: [facility] [place], [place] [id number] Rush Farmer MD [id number] Performed at [facility], [place] [address] [place],  [address]        Ardath Sax, MD

## 2018-03-25 ENCOUNTER — Ambulatory Visit: Payer: BLUE CROSS/BLUE SHIELD | Admitting: Sports Medicine

## 2018-03-31 ENCOUNTER — Emergency Department (HOSPITAL_COMMUNITY)
Admission: EM | Admit: 2018-03-31 | Discharge: 2018-03-31 | Disposition: A | Discharge status: Home / Self Care | Payer: 59 | Attending: Emergency Medicine | Admitting: Emergency Medicine

## 2018-03-31 ENCOUNTER — Other Ambulatory Visit: Payer: Self-pay | Admitting: Internal Medicine

## 2018-03-31 ENCOUNTER — Emergency Department (HOSPITAL_BASED_OUTPATIENT_CLINIC_OR_DEPARTMENT_OTHER): Payer: 59

## 2018-03-31 ENCOUNTER — Encounter (HOSPITAL_COMMUNITY): Payer: Self-pay | Admitting: Emergency Medicine

## 2018-03-31 ENCOUNTER — Emergency Department: Payer: BLUE CROSS/BLUE SHIELD

## 2018-03-31 DIAGNOSIS — Z79899 Other long term (current) drug therapy: Secondary | ICD-10-CM | POA: Insufficient documentation

## 2018-03-31 DIAGNOSIS — M79601 Pain in right arm: Secondary | ICD-10-CM | POA: Insufficient documentation

## 2018-03-31 DIAGNOSIS — M79609 Pain in unspecified limb: Secondary | ICD-10-CM | POA: Diagnosis not present

## 2018-03-31 DIAGNOSIS — J45909 Unspecified asthma, uncomplicated: Secondary | ICD-10-CM | POA: Diagnosis not present

## 2018-03-31 DIAGNOSIS — R202 Paresthesia of skin: Secondary | ICD-10-CM

## 2018-03-31 DIAGNOSIS — M5412 Radiculopathy, cervical region: Secondary | ICD-10-CM

## 2018-03-31 NOTE — ED Triage Notes (Signed)
Patient here home with complaints of right arm numbness. Sent over by PCP for [place]. Hx of Raynard's but does not want treatment for it.

## 2018-03-31 NOTE — ED Notes (Signed)
Patient given discharge teaching and verbalized understanding. Patient ambulated out of ED with a steady gait. 

## 2018-03-31 NOTE — Discharge Instructions (Signed)
Follow up with orthopedics as previously scheduled.  Follow up with Dr. Constance Goltz as directed.   Low up with vascular as needed for treatment of Raynaud's.   Return to the Emergency Department any worsening pain, redness or swelling of the arm, difficulty moving your legs or any other worsening concerning symptoms.

## 2018-03-31 NOTE — ED Notes (Signed)
Per PCP-states patient has a history of Raynard's-states she is not being treated by choice-states right arm is numb-sending for US-please call Dr Constance Goltz [phone] for results/car plan

## 2018-03-31 NOTE — ED Provider Notes (Signed)
[facility]-EMERGENCY DEPT Provider Note   CSN: [id number] Arrival date & time: 03/31/18  1347    History   Chief Complaint Chief Complaint  Patient presents with   Numbness   Arm Pain    HPI Kendra Berger is a 43 y.o. female with past medical history of de Quervain's, depression, IBS, Raynaud's who presents for intermittent right upper extremity pain, numbness that is been ongoing for the last 2 months.  Patient reports that about 2 months ago, she was working out at [employer] and states she started having some pain when she did a particular arm exercise.  She states that since then, she has had similar pain in the upper arm as well as some intermittent numbness tingling sensation to her fourth and fifth digits.  She reports occasionally, she will feel like the arm is weak.  He states that the tingling sensation, mostly when her elbow is in a flexed position and would take a while to resolve.  She went to PCP today who told her to come to the emergency department for an ultrasound for evaluation of DVT.  Additionally, patient has an MRI of her C-spine and right upper extremity scheduled.  Patient states she has not noticed any overlying warmth, erythema.  She denies any other trauma, injury.  Patient states she had a history of a DVT in her lower extremity in 2011.  Unclear what the cause was.  She is not currently on any blood thinners.  Patient denies any fevers, vision changes, chest pain, difficulty breathing, abdominal pain.     The history is provided by the patient.    Past Medical History:  Diagnosis Date   Anemia    Anxiety    Asthma    De Quervain's disease (radial styloid tenosynovitis)    Depression    IBS (irritable bowel syndrome)    TMJ (dislocation of temporomandibular joint)     Patient Active Problem List   Diagnosis Date Noted   History of DVT of lower extremity 05/17/2017   Raynaud's phenomenon without gangrene 05/17/2017     Anxiety 03/24/2015   Asthma 03/24/2015   De Quervain's tenosynovitis 03/24/2015   IBS (irritable bowel syndrome) 03/24/2015   Insomnia 03/24/2015   Major depression, recurrent (HCC) 03/24/2015    History reviewed. No pertinent surgical history.   OB History   No obstetric history on file.      Home Medications    Prior to Admission medications   Medication Sig Start Date End Date Taking? Authorizing Provider  buPROPion (WELLBUTRIN XL) 150 MG 24 hr tablet Take 300 mg by mouth daily.    Yes [provider]  ibuprofen (ADVIL,MOTRIN) 200 MG tablet Take 200 mg by mouth every 6 (six) hours as needed for moderate pain.   Yes [provider]  loratadine (ALLERGY) 10 MG tablet Take 10 mg by mouth daily.   Yes [provider]    Family History Family History  Problem Relation Age of Onset   Diabetes Father        several relatives on fathers side   Bladder Cancer Maternal Grandmother     Social History Social History   Tobacco Use   Smoking status: Never Smoker   Smokeless tobacco: Never Used  Substance Use Topics   Alcohol use: Yes    Alcohol/week: 1.0 standard drinks    Types: 1 Standard drinks or equivalent per week   Drug use: Not on file  Allergies   Prednisone   Review of Systems Review of Systems  Constitutional: Negative for fever.  Eyes: Negative for visual disturbance.  Respiratory: Negative for cough and shortness of breath.   Cardiovascular: Negative for chest pain.  Gastrointestinal: Negative for abdominal pain, nausea and vomiting.  Genitourinary: Negative for dysuria and hematuria.  Musculoskeletal:       Right upper extremity pain  Skin: Negative for color change.  Neurological: Positive for weakness and numbness. Negative for headaches.  All other systems reviewed and are negative.    Physical Exam Updated Vital Signs BP 121/72 (BP Location: Left Arm)    Pulse 66    Temp 98.7 F (37.1 C) (Oral)     Resp 14    SpO2 96%   Physical Exam Vitals signs and nursing note reviewed.  Constitutional:      Appearance: Normal appearance. She is well-developed.  HENT:     Head: Normocephalic and atraumatic.  Eyes:     General: Lids are normal.     Conjunctiva/sclera: Conjunctivae normal.     Pupils: Pupils are equal, round, and reactive to light.  Neck:     Musculoskeletal: Full passive range of motion without pain.     Comments: Full flexion/extension and lateral movement of neck fully intact. No bony midline tenderness. No deformities or crepitus. Negative Spurling's.  Cardiovascular:     Rate and Rhythm: Normal rate and regular rhythm.     Pulses: Normal pulses.          Radial pulses are 2+ on the right side and 2+ on the left side.       Dorsalis pedis pulses are 2+ on the right side and 2+ on the left side.     Heart sounds: Normal heart sounds. No murmur. No friction rub. No gallop.   Pulmonary:     Effort: Pulmonary effort is normal.     Breath sounds: Normal breath sounds.  Abdominal:     Palpations: Abdomen is soft. Abdomen is not rigid.     Tenderness: There is no abdominal tenderness. There is no guarding.  Musculoskeletal: Normal range of motion.     Comments: Mild muscular tenderness over the right deltoid.  No overlying warmth, erythema, edema.  Positive Neer's, Hawkins, empty can test on the right upper extremity.  Full range of motion of right upper extremity with any difficulty.  Full range of motion of left upper extremity that he difficulty.  Negative Neer's, Hawkins, empty can, liftoff test.  Bilateral upper extremities are symmetric in appearance without any overlying warmth, erythema, edema.  Negative Phalen's, Tinel's.  Skin:    General: Skin is warm and dry.     Capillary Refill: Capillary refill takes less than 2 seconds.     Comments: Good distal cap refill.  RUE is not dusky in appearance or cool to touch.  Neurological:     Mental Status: She is alert and  oriented to person, place, and time.     Comments: Cranial nerves III-XII intact Follows commands, Moves all extremities  5/5 strength to BUE and BLE  Sensation intact throughout all major nerve distributions, including C6-C8 on RUE. Patient can discriminate between sharp and soft between all major dermatome distributions.  Normal two-point discrimination noted on right upper extremity. Normal coordination. No gait abnormalities  No slurred speech. No facial droop.   Psychiatric:        Speech: Speech normal.      ED Treatments / Results  Labs (  all labs ordered are listed, but only abnormal results are displayed) Labs Reviewed - No data to display  EKG None  Radiology Ue Venous Duplex (mc & Wl 7 Am - 7 Pm)  Result Date: 03/31/2018 UPPER VENOUS STUDY  Indications: Pain Comparison Study: No comparison study available Performing Technologist: Melodie Bouillon  Examination Guidelines: A complete evaluation includes B-mode imaging, spectral Doppler, color Doppler, and power Doppler as needed of all accessible portions of each vessel. Bilateral testing is considered an integral part of a complete examination. Limited examinations for reoccurring indications may be performed as noted.  Right Findings: +----------+------------+---------+-----------+----------+-------+  RIGHT      Compressible Phasicity Spontaneous Properties Summary  +----------+------------+---------+-----------+----------+-------+  IJV            Full        Yes        Yes                         +----------+------------+---------+-----------+----------+-------+  Subclavian     Full        Yes        Yes                         +----------+------------+---------+-----------+----------+-------+  Axillary       Full        Yes        Yes                         +----------+------------+---------+-----------+----------+-------+  Brachial       Full        Yes        Yes                          +----------+------------+---------+-----------+----------+-------+  Radial         Full                                               +----------+------------+---------+-----------+----------+-------+  Ulnar          Full                                               +----------+------------+---------+-----------+----------+-------+  Cephalic       Full                                               +----------+------------+---------+-----------+----------+-------+  Basilic        Full        Yes        Yes                         +----------+------------+---------+-----------+----------+-------+  Left Findings: +----------+------------+---------+-----------+----------+-------+  LEFT       Compressible Phasicity Spontaneous Properties Summary  +----------+------------+---------+-----------+----------+-------+  Subclavian     Full        Yes        Yes                         +----------+------------+---------+-----------+----------+-------+  Summary:  Right: No evidence of deep vein thrombosis in the upper extremity. No evidence of superficial vein thrombosis in the upper extremity. No evidence of thrombosis in the subclavian.  Left: No evidence of thrombosis in the subclavian.  *See table(s) above for measurements and observations.    Preliminary     Procedures Procedures (including critical care time)  Medications Ordered in ED Medications - No data to display   Initial Impression / Assessment and Plan / ED Course  I have reviewed the triage vital signs and the nursing notes.  Pertinent labs & imaging results that were available during my care of the patient were reviewed by me and considered in my medical decision making (see chart for details).        43 year old female who presents for evaluation of 2 months of intermittent right upper extremity pain, numbness, weakness.  Sent by PCP for ultrasound evaluation of her symptoms.  History of DVT in the lower extremity.  Unclear of etiology.  Not  currently on blood thinners. Patient is afebrile, non-toxic appearing, sitting comfortably on examination table. Vital signs reviewed and stable. Patient is neurovascularly intact.  On my exam, she does not exhibit any weakness and has full strength of bilateral upper extremities.  Additionally, there is no overlying warmth, erythema, edema.  Patient with good cap refill, strong radial pulse on right upper extremity.  Right upper extremities are not cool to touch or dusky in appearance.  History/physical exam is not concerning for acute arterial embolism, ischemic limb.  Suspect ulnar nerve compression versus rotator cuff impingement/pathology.  History/physical exam not concerning for ischemic limb, septic arthritis.  Doubt DVT given lack of symptoms.  Discussed with Dr. Lynnae Prude (patient's PCP) and would like to go and proceed with ultrasound for evaluation of DVT.  She has an MRI of her neck and shoulder scheduled for her today.  U/S Shows no evidence of DVT.  Discussed results with patient.  Patient is seeing a orthopedic doctor in the next 2 days and also has an MRI of her shoulder and neck scheduled.  Encouraged her to keep those appointments.  At this time, patient has good radial pulse with evidence of good cap refill.  Exam not concerning for ischemic limb.  Discussed with patient that this could be musculoskeletal etiology versus rotator cuff impingement versus ulnar compression.  Instructed patient to follow-up with orthopedics as directed. At this time, patient exhibits no emergent life-threatening condition that require further evaluation in ED or admission. Patient had ample opportunity for questions and discussion. All patient's questions were answered with full understanding. Strict return precautions discussed. Patient expresses understanding and agreement to plan.   Portions of this note were generated with Scientist, clinical (histocompatibility and immunogenetics). Dictation errors may occur despite best attempts at  proofreading.   Final Clinical Impressions(s) / ED Diagnoses   Final diagnoses:  Right arm pain  Paresthesia    ED Discharge Orders    None       Rosana Hoes 03/31/18 2307    Wynetta Fines, MD 03/31/18 2310

## 2018-03-31 NOTE — Progress Notes (Signed)
Right upper extremity venous duplex exam completed. Please see preliminary notes on CV PROC under chart review. Emilio Baylock H Jovonta Levit(RDMS RVT) 03/31/18 5:48 PM

## 2018-10-14 ENCOUNTER — Other Ambulatory Visit: Payer: Self-pay | Admitting: Internal Medicine

## 2018-10-14 DIAGNOSIS — Z1231 Encounter for screening mammogram for malignant neoplasm of breast: Secondary | ICD-10-CM

## 2018-11-28 ENCOUNTER — Other Ambulatory Visit: Payer: Self-pay

## 2018-11-28 DIAGNOSIS — Z20822 Contact with and (suspected) exposure to covid-19: Secondary | ICD-10-CM

## 2018-11-29 ENCOUNTER — Ambulatory Visit: Payer: 59

## 2018-11-29 LAB — NOVEL CORONAVIRUS, NAA: SARS-CoV-2, NAA: NOT DETECTED

## 2019-01-11 ENCOUNTER — Other Ambulatory Visit: Payer: Self-pay

## 2019-01-11 ENCOUNTER — Ambulatory Visit
Admission: RE | Admit: 2019-01-11 | Discharge: 2019-01-11 | Disposition: A | Discharge status: Home / Self Care | Payer: 59 | Source: Ambulatory Visit | Attending: Internal Medicine | Admitting: Internal Medicine

## 2019-01-11 DIAGNOSIS — Z1231 Encounter for screening mammogram for malignant neoplasm of breast: Secondary | ICD-10-CM

## 2019-10-05 DIAGNOSIS — M461 Sacroiliitis, not elsewhere classified: Secondary | ICD-10-CM | POA: Diagnosis not present

## 2019-10-13 DIAGNOSIS — M461 Sacroiliitis, not elsewhere classified: Secondary | ICD-10-CM | POA: Diagnosis not present

## 2019-10-31 DIAGNOSIS — M461 Sacroiliitis, not elsewhere classified: Secondary | ICD-10-CM | POA: Diagnosis not present

## 2019-10-31 DIAGNOSIS — M25552 Pain in left hip: Secondary | ICD-10-CM | POA: Diagnosis not present

## 2019-11-03 DIAGNOSIS — M25559 Pain in unspecified hip: Secondary | ICD-10-CM | POA: Diagnosis not present

## 2019-11-07 DIAGNOSIS — M5136 Other intervertebral disc degeneration, lumbar region: Secondary | ICD-10-CM | POA: Diagnosis not present

## 2019-11-07 DIAGNOSIS — M25552 Pain in left hip: Secondary | ICD-10-CM | POA: Diagnosis not present

## 2019-11-10 DIAGNOSIS — M5136 Other intervertebral disc degeneration, lumbar region: Secondary | ICD-10-CM | POA: Diagnosis not present

## 2019-12-04 DIAGNOSIS — M5136 Other intervertebral disc degeneration, lumbar region: Secondary | ICD-10-CM | POA: Diagnosis not present

## 2019-12-04 DIAGNOSIS — M25552 Pain in left hip: Secondary | ICD-10-CM | POA: Diagnosis not present

## 2019-12-04 DIAGNOSIS — M545 Low back pain, unspecified: Secondary | ICD-10-CM | POA: Diagnosis not present

## 2019-12-08 DIAGNOSIS — M25552 Pain in left hip: Secondary | ICD-10-CM | POA: Diagnosis not present

## 2020-01-05 DIAGNOSIS — Z01419 Encounter for gynecological examination (general) (routine) without abnormal findings: Secondary | ICD-10-CM | POA: Diagnosis not present

## 2020-01-05 DIAGNOSIS — N870 Mild cervical dysplasia: Secondary | ICD-10-CM | POA: Diagnosis not present

## 2020-01-05 DIAGNOSIS — R87612 Low grade squamous intraepithelial lesion on cytologic smear of cervix (LGSIL): Secondary | ICD-10-CM | POA: Diagnosis not present

## 2020-01-09 ENCOUNTER — Other Ambulatory Visit: Payer: Self-pay | Admitting: Internal Medicine

## 2020-01-09 ENCOUNTER — Other Ambulatory Visit: Payer: Self-pay | Admitting: Obstetrics and Gynecology

## 2020-01-09 DIAGNOSIS — M545 Low back pain, unspecified: Secondary | ICD-10-CM | POA: Diagnosis not present

## 2020-01-09 DIAGNOSIS — M5136 Other intervertebral disc degeneration, lumbar region: Secondary | ICD-10-CM | POA: Diagnosis not present

## 2020-01-09 DIAGNOSIS — M25552 Pain in left hip: Secondary | ICD-10-CM | POA: Diagnosis not present

## 2020-01-09 DIAGNOSIS — Z1231 Encounter for screening mammogram for malignant neoplasm of breast: Secondary | ICD-10-CM

## 2020-02-02 DIAGNOSIS — M6281 Muscle weakness (generalized): Secondary | ICD-10-CM | POA: Diagnosis not present

## 2020-02-02 DIAGNOSIS — M25552 Pain in left hip: Secondary | ICD-10-CM | POA: Diagnosis not present

## 2020-02-02 DIAGNOSIS — M5451 Vertebrogenic low back pain: Secondary | ICD-10-CM | POA: Diagnosis not present

## 2020-02-02 DIAGNOSIS — M5136 Other intervertebral disc degeneration, lumbar region: Secondary | ICD-10-CM | POA: Diagnosis not present

## 2020-02-13 DIAGNOSIS — M5451 Vertebrogenic low back pain: Secondary | ICD-10-CM | POA: Diagnosis not present

## 2020-02-13 DIAGNOSIS — M5136 Other intervertebral disc degeneration, lumbar region: Secondary | ICD-10-CM | POA: Diagnosis not present

## 2020-02-13 DIAGNOSIS — M25552 Pain in left hip: Secondary | ICD-10-CM | POA: Diagnosis not present

## 2020-02-13 DIAGNOSIS — M6281 Muscle weakness (generalized): Secondary | ICD-10-CM | POA: Diagnosis not present

## 2020-02-19 ENCOUNTER — Other Ambulatory Visit: Payer: Self-pay

## 2020-02-19 ENCOUNTER — Ambulatory Visit
Admission: RE | Admit: 2020-02-19 | Discharge: 2020-02-19 | Disposition: A | Discharge status: Home / Self Care | Payer: 59 | Source: Ambulatory Visit | Attending: Obstetrics and Gynecology | Admitting: Obstetrics and Gynecology

## 2020-02-19 DIAGNOSIS — Z1231 Encounter for screening mammogram for malignant neoplasm of breast: Secondary | ICD-10-CM | POA: Diagnosis not present

## 2020-02-20 DIAGNOSIS — M5136 Other intervertebral disc degeneration, lumbar region: Secondary | ICD-10-CM | POA: Diagnosis not present

## 2020-02-20 DIAGNOSIS — M25552 Pain in left hip: Secondary | ICD-10-CM | POA: Diagnosis not present

## 2020-02-20 DIAGNOSIS — M5451 Vertebrogenic low back pain: Secondary | ICD-10-CM | POA: Diagnosis not present

## 2020-02-20 DIAGNOSIS — M6281 Muscle weakness (generalized): Secondary | ICD-10-CM | POA: Diagnosis not present

## 2020-02-23 ENCOUNTER — Ambulatory Visit: Payer: 59

## 2020-02-27 DIAGNOSIS — M25552 Pain in left hip: Secondary | ICD-10-CM | POA: Diagnosis not present

## 2020-02-27 DIAGNOSIS — M5451 Vertebrogenic low back pain: Secondary | ICD-10-CM | POA: Diagnosis not present

## 2020-02-27 DIAGNOSIS — M5136 Other intervertebral disc degeneration, lumbar region: Secondary | ICD-10-CM | POA: Diagnosis not present

## 2020-02-27 DIAGNOSIS — M6281 Muscle weakness (generalized): Secondary | ICD-10-CM | POA: Diagnosis not present

## 2020-03-05 DIAGNOSIS — M5136 Other intervertebral disc degeneration, lumbar region: Secondary | ICD-10-CM | POA: Diagnosis not present

## 2020-03-05 DIAGNOSIS — M6281 Muscle weakness (generalized): Secondary | ICD-10-CM | POA: Diagnosis not present

## 2020-03-05 DIAGNOSIS — M25552 Pain in left hip: Secondary | ICD-10-CM | POA: Diagnosis not present

## 2020-03-05 DIAGNOSIS — M5451 Vertebrogenic low back pain: Secondary | ICD-10-CM | POA: Diagnosis not present

## 2020-03-07 DIAGNOSIS — D1801 Hemangioma of skin and subcutaneous tissue: Secondary | ICD-10-CM | POA: Diagnosis not present

## 2020-03-07 DIAGNOSIS — D2239 Melanocytic nevi of other parts of face: Secondary | ICD-10-CM | POA: Diagnosis not present

## 2020-03-07 DIAGNOSIS — L858 Other specified epidermal thickening: Secondary | ICD-10-CM | POA: Diagnosis not present

## 2020-03-07 DIAGNOSIS — D225 Melanocytic nevi of trunk: Secondary | ICD-10-CM | POA: Diagnosis not present

## 2020-03-13 DIAGNOSIS — H6503 Acute serous otitis media, bilateral: Secondary | ICD-10-CM | POA: Diagnosis not present

## 2020-03-18 DIAGNOSIS — R87612 Low grade squamous intraepithelial lesion on cytologic smear of cervix (LGSIL): Secondary | ICD-10-CM | POA: Diagnosis not present

## 2020-03-18 DIAGNOSIS — Z3202 Encounter for pregnancy test, result negative: Secondary | ICD-10-CM | POA: Diagnosis not present

## 2020-03-18 DIAGNOSIS — B977 Papillomavirus as the cause of diseases classified elsewhere: Secondary | ICD-10-CM | POA: Diagnosis not present

## 2020-03-19 DIAGNOSIS — M25552 Pain in left hip: Secondary | ICD-10-CM | POA: Diagnosis not present

## 2020-03-19 DIAGNOSIS — M6281 Muscle weakness (generalized): Secondary | ICD-10-CM | POA: Diagnosis not present

## 2020-03-19 DIAGNOSIS — M545 Low back pain, unspecified: Secondary | ICD-10-CM | POA: Diagnosis not present

## 2020-03-19 DIAGNOSIS — M5451 Vertebrogenic low back pain: Secondary | ICD-10-CM | POA: Diagnosis not present

## 2020-03-19 DIAGNOSIS — M5136 Other intervertebral disc degeneration, lumbar region: Secondary | ICD-10-CM | POA: Diagnosis not present

## 2020-04-10 DIAGNOSIS — M6281 Muscle weakness (generalized): Secondary | ICD-10-CM | POA: Diagnosis not present

## 2020-04-10 DIAGNOSIS — M5451 Vertebrogenic low back pain: Secondary | ICD-10-CM | POA: Diagnosis not present

## 2020-04-10 DIAGNOSIS — M5459 Other low back pain: Secondary | ICD-10-CM | POA: Diagnosis not present

## 2020-04-10 DIAGNOSIS — M5136 Other intervertebral disc degeneration, lumbar region: Secondary | ICD-10-CM | POA: Diagnosis not present

## 2020-04-10 DIAGNOSIS — M25552 Pain in left hip: Secondary | ICD-10-CM | POA: Diagnosis not present

## 2020-04-16 DIAGNOSIS — M5136 Other intervertebral disc degeneration, lumbar region: Secondary | ICD-10-CM | POA: Diagnosis not present

## 2020-04-16 DIAGNOSIS — M6281 Muscle weakness (generalized): Secondary | ICD-10-CM | POA: Diagnosis not present

## 2020-04-16 DIAGNOSIS — M5451 Vertebrogenic low back pain: Secondary | ICD-10-CM | POA: Diagnosis not present

## 2020-04-16 DIAGNOSIS — M25552 Pain in left hip: Secondary | ICD-10-CM | POA: Diagnosis not present

## 2020-04-30 DIAGNOSIS — M25552 Pain in left hip: Secondary | ICD-10-CM | POA: Diagnosis not present

## 2020-04-30 DIAGNOSIS — M5451 Vertebrogenic low back pain: Secondary | ICD-10-CM | POA: Diagnosis not present

## 2020-04-30 DIAGNOSIS — G894 Chronic pain syndrome: Secondary | ICD-10-CM | POA: Diagnosis not present

## 2020-04-30 DIAGNOSIS — M6281 Muscle weakness (generalized): Secondary | ICD-10-CM | POA: Diagnosis not present

## 2020-04-30 DIAGNOSIS — M5136 Other intervertebral disc degeneration, lumbar region: Secondary | ICD-10-CM | POA: Diagnosis not present

## 2020-05-14 DIAGNOSIS — M6281 Muscle weakness (generalized): Secondary | ICD-10-CM | POA: Diagnosis not present

## 2020-05-14 DIAGNOSIS — M5136 Other intervertebral disc degeneration, lumbar region: Secondary | ICD-10-CM | POA: Diagnosis not present

## 2020-05-14 DIAGNOSIS — M25552 Pain in left hip: Secondary | ICD-10-CM | POA: Diagnosis not present

## 2020-05-14 DIAGNOSIS — G894 Chronic pain syndrome: Secondary | ICD-10-CM | POA: Diagnosis not present

## 2020-05-28 DIAGNOSIS — M6281 Muscle weakness (generalized): Secondary | ICD-10-CM | POA: Diagnosis not present

## 2020-05-28 DIAGNOSIS — G894 Chronic pain syndrome: Secondary | ICD-10-CM | POA: Diagnosis not present

## 2020-05-28 DIAGNOSIS — M25552 Pain in left hip: Secondary | ICD-10-CM | POA: Diagnosis not present

## 2020-05-28 DIAGNOSIS — M5136 Other intervertebral disc degeneration, lumbar region: Secondary | ICD-10-CM | POA: Diagnosis not present

## 2020-06-03 DIAGNOSIS — H9011 Conductive hearing loss, unilateral, right ear, with unrestricted hearing on the contralateral side: Secondary | ICD-10-CM | POA: Diagnosis not present

## 2020-06-09 DIAGNOSIS — Z20822 Contact with and (suspected) exposure to covid-19: Secondary | ICD-10-CM | POA: Diagnosis not present

## 2020-06-11 DIAGNOSIS — J0140 Acute pansinusitis, unspecified: Secondary | ICD-10-CM | POA: Diagnosis not present

## 2020-06-25 DIAGNOSIS — M5136 Other intervertebral disc degeneration, lumbar region: Secondary | ICD-10-CM | POA: Diagnosis not present

## 2020-06-25 DIAGNOSIS — M6281 Muscle weakness (generalized): Secondary | ICD-10-CM | POA: Diagnosis not present

## 2020-06-25 DIAGNOSIS — M25552 Pain in left hip: Secondary | ICD-10-CM | POA: Diagnosis not present

## 2020-06-25 DIAGNOSIS — G894 Chronic pain syndrome: Secondary | ICD-10-CM | POA: Diagnosis not present

## 2020-07-09 DIAGNOSIS — G894 Chronic pain syndrome: Secondary | ICD-10-CM | POA: Diagnosis not present

## 2020-07-09 DIAGNOSIS — M25552 Pain in left hip: Secondary | ICD-10-CM | POA: Diagnosis not present

## 2020-07-09 DIAGNOSIS — M6281 Muscle weakness (generalized): Secondary | ICD-10-CM | POA: Diagnosis not present

## 2020-07-09 DIAGNOSIS — M5136 Other intervertebral disc degeneration, lumbar region: Secondary | ICD-10-CM | POA: Diagnosis not present

## 2020-07-30 DIAGNOSIS — F418 Other specified anxiety disorders: Secondary | ICD-10-CM | POA: Diagnosis not present

## 2020-08-06 DIAGNOSIS — M6281 Muscle weakness (generalized): Secondary | ICD-10-CM | POA: Diagnosis not present

## 2020-08-06 DIAGNOSIS — M25552 Pain in left hip: Secondary | ICD-10-CM | POA: Diagnosis not present

## 2020-08-06 DIAGNOSIS — M5136 Other intervertebral disc degeneration, lumbar region: Secondary | ICD-10-CM | POA: Diagnosis not present

## 2020-08-06 DIAGNOSIS — G894 Chronic pain syndrome: Secondary | ICD-10-CM | POA: Diagnosis not present

## 2020-10-15 DIAGNOSIS — Z862 Personal history of diseases of the blood and blood-forming organs and certain disorders involving the immune mechanism: Secondary | ICD-10-CM | POA: Diagnosis not present

## 2020-10-15 DIAGNOSIS — E785 Hyperlipidemia, unspecified: Secondary | ICD-10-CM | POA: Diagnosis not present

## 2020-10-15 DIAGNOSIS — F418 Other specified anxiety disorders: Secondary | ICD-10-CM | POA: Diagnosis not present

## 2020-10-15 DIAGNOSIS — I82492 Acute embolism and thrombosis of other specified deep vein of left lower extremity: Secondary | ICD-10-CM | POA: Diagnosis not present

## 2020-10-15 DIAGNOSIS — E559 Vitamin D deficiency, unspecified: Secondary | ICD-10-CM | POA: Diagnosis not present

## 2020-10-15 DIAGNOSIS — Z0000 Encounter for general adult medical examination without abnormal findings: Secondary | ICD-10-CM | POA: Diagnosis not present

## 2020-12-03 DIAGNOSIS — R41840 Attention and concentration deficit: Secondary | ICD-10-CM | POA: Diagnosis not present

## 2020-12-03 DIAGNOSIS — F419 Anxiety disorder, unspecified: Secondary | ICD-10-CM | POA: Diagnosis not present

## 2020-12-25 ENCOUNTER — Other Ambulatory Visit: Payer: Self-pay | Admitting: Internal Medicine

## 2020-12-25 ENCOUNTER — Other Ambulatory Visit: Payer: Self-pay | Admitting: Obstetrics and Gynecology

## 2020-12-25 DIAGNOSIS — Z1231 Encounter for screening mammogram for malignant neoplasm of breast: Secondary | ICD-10-CM

## 2021-01-09 DIAGNOSIS — D219 Benign neoplasm of connective and other soft tissue, unspecified: Secondary | ICD-10-CM | POA: Diagnosis not present

## 2021-01-09 DIAGNOSIS — Z124 Encounter for screening for malignant neoplasm of cervix: Secondary | ICD-10-CM | POA: Diagnosis not present

## 2021-01-09 DIAGNOSIS — Z01419 Encounter for gynecological examination (general) (routine) without abnormal findings: Secondary | ICD-10-CM | POA: Diagnosis not present

## 2021-01-09 DIAGNOSIS — N920 Excessive and frequent menstruation with regular cycle: Secondary | ICD-10-CM | POA: Diagnosis not present

## 2021-02-04 DIAGNOSIS — N920 Excessive and frequent menstruation with regular cycle: Secondary | ICD-10-CM | POA: Diagnosis not present

## 2021-02-04 DIAGNOSIS — D219 Benign neoplasm of connective and other soft tissue, unspecified: Secondary | ICD-10-CM | POA: Diagnosis not present

## 2021-02-18 DIAGNOSIS — D219 Benign neoplasm of connective and other soft tissue, unspecified: Secondary | ICD-10-CM | POA: Diagnosis not present

## 2021-02-18 DIAGNOSIS — N951 Menopausal and female climacteric states: Secondary | ICD-10-CM | POA: Diagnosis not present

## 2021-02-18 DIAGNOSIS — N920 Excessive and frequent menstruation with regular cycle: Secondary | ICD-10-CM | POA: Diagnosis not present

## 2021-02-28 ENCOUNTER — Ambulatory Visit
Admission: RE | Admit: 2021-02-28 | Discharge: 2021-02-28 | Disposition: A | Discharge status: Home / Self Care | Payer: BC Managed Care – PPO | Source: Ambulatory Visit | Attending: Internal Medicine | Admitting: Internal Medicine

## 2021-02-28 DIAGNOSIS — Z1231 Encounter for screening mammogram for malignant neoplasm of breast: Secondary | ICD-10-CM

## 2021-03-03 ENCOUNTER — Other Ambulatory Visit: Payer: Self-pay | Admitting: Internal Medicine

## 2021-03-03 DIAGNOSIS — R928 Other abnormal and inconclusive findings on diagnostic imaging of breast: Secondary | ICD-10-CM

## 2021-03-17 DIAGNOSIS — L811 Chloasma: Secondary | ICD-10-CM | POA: Diagnosis not present

## 2021-03-17 DIAGNOSIS — D2262 Melanocytic nevi of left upper limb, including shoulder: Secondary | ICD-10-CM | POA: Diagnosis not present

## 2021-03-17 DIAGNOSIS — D225 Melanocytic nevi of trunk: Secondary | ICD-10-CM | POA: Diagnosis not present

## 2021-03-17 DIAGNOSIS — D1801 Hemangioma of skin and subcutaneous tissue: Secondary | ICD-10-CM | POA: Diagnosis not present

## 2021-03-25 ENCOUNTER — Ambulatory Visit
Admission: RE | Admit: 2021-03-25 | Discharge: 2021-03-25 | Disposition: A | Discharge status: Home / Self Care | Payer: BC Managed Care – PPO | Source: Ambulatory Visit | Attending: Internal Medicine | Admitting: Internal Medicine

## 2021-03-25 DIAGNOSIS — R928 Other abnormal and inconclusive findings on diagnostic imaging of breast: Secondary | ICD-10-CM

## 2021-03-25 DIAGNOSIS — N6489 Other specified disorders of breast: Secondary | ICD-10-CM | POA: Diagnosis not present

## 2021-03-25 DIAGNOSIS — R922 Inconclusive mammogram: Secondary | ICD-10-CM | POA: Diagnosis not present

## 2021-05-23 DIAGNOSIS — R41840 Attention and concentration deficit: Secondary | ICD-10-CM | POA: Diagnosis not present

## 2021-05-23 DIAGNOSIS — F418 Other specified anxiety disorders: Secondary | ICD-10-CM | POA: Diagnosis not present

## 2021-07-08 DIAGNOSIS — F418 Other specified anxiety disorders: Secondary | ICD-10-CM | POA: Diagnosis not present

## 2021-07-08 DIAGNOSIS — M25511 Pain in right shoulder: Secondary | ICD-10-CM | POA: Diagnosis not present

## 2021-07-08 DIAGNOSIS — R41840 Attention and concentration deficit: Secondary | ICD-10-CM | POA: Diagnosis not present

## 2021-07-09 ENCOUNTER — Telehealth: Payer: Self-pay

## 2021-07-09 NOTE — Telephone Encounter (Signed)
Per request earlier today, tried calling patient to schedule appt to eval frozen shoulder-no answer.  Left message advising to call back to schedule appointment with you

## 2021-07-28 ENCOUNTER — Ambulatory Visit: Payer: BC Managed Care – PPO | Admitting: Surgical

## 2021-07-28 ENCOUNTER — Ambulatory Visit: Payer: Self-pay

## 2021-07-28 ENCOUNTER — Ambulatory Visit (INDEPENDENT_AMBULATORY_CARE_PROVIDER_SITE_OTHER): Payer: BC Managed Care – PPO

## 2021-07-28 DIAGNOSIS — M25511 Pain in right shoulder: Secondary | ICD-10-CM

## 2021-07-28 DIAGNOSIS — M7501 Adhesive capsulitis of right shoulder: Secondary | ICD-10-CM | POA: Diagnosis not present

## 2021-07-31 ENCOUNTER — Encounter: Payer: Self-pay | Admitting: Orthopedic Surgery

## 2021-07-31 MED ORDER — LIDOCAINE HCL 1 % IJ SOLN
5.0000 mL | INTRAMUSCULAR | Status: AC | PRN
  Administered 2021-07-28: 5 mL

## 2021-07-31 MED ORDER — BUPIVACAINE HCL 0.5 % IJ SOLN
9.0000 mL | INTRAMUSCULAR | Status: AC | PRN
  Administered 2021-07-28: 9 mL via INTRA_ARTICULAR

## 2021-07-31 MED ORDER — METHYLPREDNISOLONE ACETATE 40 MG/ML IJ SUSP
40.0000 mg | INTRAMUSCULAR | Status: AC | PRN
  Administered 2021-07-28: 40 mg via INTRA_ARTICULAR

## 2021-07-31 NOTE — Progress Notes (Signed)
Office Visit Note   Patient: Kendra Berger           Date of Birth: 1975-02-28           MRN: [id number] Visit Date: 07/28/2021 Requested by: Kendrick Ranch, MD [address] [place],  [place] [address] PCP: Kendrick Ranch, MD  Subjective: Chief Complaint  Patient presents with   Right Shoulder - Pain    HPI: Kendra Berger is a 46 y.o. female who presents to the office complaining of right shoulder pain.  Patient complains of lateral, posterior axillary, scapular pain that has been bothering her more over the last several months.  Denies any history of injury.  Has gone to the point where wakes her up at night.  It has limited her in her activities.  She enjoys doing yoga, running, calisthenics.  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  No history of diabetes, smoking, thyroid disease.  He does have a history of frozen shoulder of the same shoulder that she dealt with 3 years ago.  She saw Dr. Jerl Santos at that time and eventually had to have arthroscopy with manipulation under anesthesia.  She did well after going through physical therapy following manipulation.  She had MRI about 3 years ago around that time that demonstrated no pathology in the shoulder aside from frozen shoulder.  At that time, she presented to the emergency department with similar complaints that she complains about today including pain with working out, pain in the upper arm, tingling sensation in the fourth and fifth fingers of the right arm.  She still has occasional numbness and tingling in the fourth and fifth fingers.  Also describes neck pain that is slightly worse in the last couple months.  Takes Aleve as needed.  Occasionally uses muscle relaxers.  She has tried dry needling and cupping without much long-lasting relief but it does help for about a week or so.              ROS: All systems reviewed are negative as they relate to the chief complaint within  the history of present illness.  Patient denies fevers or chills.  Assessment & Plan: Visit Diagnoses:  1. Right shoulder pain, unspecified chronicity   2. Adhesive capsulitis of right shoulder     Plan: Patient is a 46 year old female who presents for evaluation of right shoulder pain.  She has history of right shoulder frozen shoulder that she dealt with 3 years ago and had manipulation under anesthesia along with right shoulder arthroscopy by Dr. Jerl Santos.  She did well following that procedure and physical therapy.  She has begun to notice similar symptoms in the last several months.  She does have subtle loss of range of motion of the right shoulder compared with the left on exam today.  Also has tenderness over the bicipital groove which may reflect some inflammatory synovitis in the bicep tendon sheath in connection with the glenohumeral joint.  Radiographs without any significant degenerative changes or acute changes.  After discussion of options, plan to try right shoulder glenohumeral injection today.  Ultrasound utilized.  Patient tolerated the procedure well.  Plan for her to follow-up with the office in 4 to 6 weeks for clinical recheck.  Need to recheck her range of motion at that time and see how her pain is doing.  If she has absolutely 0% relief from the injection, she will call at 2  weeks to update [place].  Follow-Up Instructions: No follow-ups on file.   Orders:  Orders Placed This Encounter  Procedures   XR Shoulder Right   US Guided Needle Placement - No Linked Charges   No orders of the defined types were placed in this encounter.     Procedures: Large Joint Inj: R glenohumeral on 07/28/2021 10:49 AM Indications: diagnostic evaluation and pain Details: 18 G 3.5 in needle, ultrasound-guided posterior approach  Arthrogram: No  Medications: 9 mL bupivacaine 0.5 %; 40 mg methylPREDNISolone acetate 40 MG/ML; 5 mL lidocaine 1 % Outcome: tolerated well, no immediate  complications Procedure, treatment alternatives, risks and benefits explained, specific risks discussed. Consent was given by the patient. Immediately prior to procedure a time out was called to verify the correct patient, procedure, equipment, support staff and site/side marked as required. Patient was prepped and draped in the usual sterile fashion.       Clinical Data: No additional findings.  Objective: Vital Signs: There were no vitals taken for this visit.  Physical Exam:  Constitutional: Patient appears well-developed HEENT:  Head: Normocephalic Eyes:EOM are normal Neck: Normal range of motion Cardiovascular: Normal rate Pulmonary/chest: Effort normal Neurologic: Patient is alert Skin: Skin is warm Psychiatric: Patient has normal mood and affect  Ortho Exam: Ortho exam demonstrates right shoulder with 70 degrees X rotation, 100 degrees abduction, 180 degrees forward flexion.  This compared with the left shoulder with 80 degrees external rotation, 130 degrees abduction, 180 degrees forward flexion.  She has about 4 less vertebral levels of internal rotation of the right shoulder compared with the left.  Tenderness over the bicipital groove moderately.  No tenderness over the [facility] joint.  She has excellent rotator cuff strength of supra, infra, subscap with no real reproduction in her pain.  Mild pain with supination strength testing.  5/5 motor strength of bilateral grip strength, finger abduction, pronation/supination, bicep, tricep, deltoid.  Axillary nerve is intact with deltoid firing.  Sensation intact over the lateral deltoid.  No significant tenderness throughout the axial cervical spine.  No pain with cervical spine range of motion.  Negative Spurling sign.  Negative Lhermitte sign.  Specialty Comments:  No specialty comments available.  Imaging: No results found.   PMFS History: Patient Active Problem List   Diagnosis Date Noted   History of DVT of lower extremity  05/17/2017   Raynaud's phenomenon without gangrene 05/17/2017   Anxiety 03/24/2015   Asthma 03/24/2015   De Quervain's tenosynovitis 03/24/2015   IBS (irritable bowel syndrome) 03/24/2015   Insomnia 03/24/2015   Major depression, recurrent (HCC) 03/24/2015   Past Medical History:  Diagnosis Date   Anemia    Anxiety    Asthma    De Quervain's disease (radial styloid tenosynovitis)    Depression    IBS (irritable bowel syndrome)    TMJ (dislocation of temporomandibular joint)     Family History  Problem Relation Age of Onset   Diabetes Father        several relatives on fathers side   Bladder Cancer Maternal Grandmother     No past surgical history on file. Social History   Occupational History   Occupation: Biomedical scientist  Tobacco Use   Smoking status: Never   Smokeless tobacco: Never  Substance and Sexual Activity   Alcohol use: Yes    Alcohol/week: 1.0 standard drink of alcohol    Types: 1 Standard drinks or equivalent per week   Drug use: Not on file   Sexual  activity: Not on file

## 2021-08-01 ENCOUNTER — Ambulatory Visit: Payer: BC Managed Care – PPO | Admitting: Surgical

## 2021-08-22 ENCOUNTER — Emergency Department (HOSPITAL_COMMUNITY): Payer: BC Managed Care – PPO

## 2021-08-22 ENCOUNTER — Ambulatory Visit: Payer: Self-pay

## 2021-08-22 ENCOUNTER — Other Ambulatory Visit: Payer: Self-pay

## 2021-08-22 ENCOUNTER — Observation Stay (HOSPITAL_COMMUNITY)
Admission: EM | Admit: 2021-08-22 | Discharge: 2021-08-23 | Disposition: A | Discharge status: Home / Self Care | Payer: BC Managed Care – PPO | Attending: General Surgery | Admitting: General Surgery

## 2021-08-22 ENCOUNTER — Ambulatory Visit: Payer: BC Managed Care – PPO | Admitting: Surgical

## 2021-08-22 ENCOUNTER — Ambulatory Visit (INDEPENDENT_AMBULATORY_CARE_PROVIDER_SITE_OTHER): Payer: BC Managed Care – PPO

## 2021-08-22 ENCOUNTER — Encounter (HOSPITAL_COMMUNITY): Payer: Self-pay | Admitting: Emergency Medicine

## 2021-08-22 ENCOUNTER — Telehealth: Payer: Self-pay

## 2021-08-22 DIAGNOSIS — Z79899 Other long term (current) drug therapy: Secondary | ICD-10-CM | POA: Insufficient documentation

## 2021-08-22 DIAGNOSIS — R0602 Shortness of breath: Secondary | ICD-10-CM

## 2021-08-22 DIAGNOSIS — S270XXA Traumatic pneumothorax, initial encounter: Secondary | ICD-10-CM | POA: Diagnosis not present

## 2021-08-22 DIAGNOSIS — Z86718 Personal history of other venous thrombosis and embolism: Secondary | ICD-10-CM | POA: Diagnosis not present

## 2021-08-22 DIAGNOSIS — Y848 Other medical procedures as the cause of abnormal reaction of the patient, or of later complication, without mention of misadventure at the time of the procedure: Secondary | ICD-10-CM | POA: Insufficient documentation

## 2021-08-22 DIAGNOSIS — J45909 Unspecified asthma, uncomplicated: Secondary | ICD-10-CM | POA: Insufficient documentation

## 2021-08-22 DIAGNOSIS — J9589 Other postprocedural complications and disorders of respiratory system, not elsewhere classified: Secondary | ICD-10-CM | POA: Diagnosis not present

## 2021-08-22 DIAGNOSIS — M25511 Pain in right shoulder: Secondary | ICD-10-CM | POA: Diagnosis not present

## 2021-08-22 DIAGNOSIS — J9383 Other pneumothorax: Secondary | ICD-10-CM | POA: Diagnosis not present

## 2021-08-22 DIAGNOSIS — J939 Pneumothorax, unspecified: Secondary | ICD-10-CM | POA: Diagnosis present

## 2021-08-22 NOTE — ED Notes (Signed)
ED TO INPATIENT HANDOFF REPORT  ED Nurse Name and Phone #: Morrie Sheldon RN [phone]  S Name/Age/Gender Kendra Berger 46 y.o. female Room/Bed: 033C/033C  Code Status   Code Status: Not on file  Home/SNF/Other Home Patient oriented to: self, place, time, and situation Is this baseline? Yes   Triage Complete: Triage complete  Chief Complaint Pneumothorax [J93.9]  Triage Note Pt sent from ortho after trigger point injection of shoulder this morning. Developed increasing CP and shob.  Xray showed pneumo on the right . Shallow breaths.  States symptoms are worse than when they started.    Allergies Allergies  Allergen Reactions   Prednisone     Level of Care/Admitting Diagnosis ED Disposition     ED Disposition  Admit   Condition  --   Comment  Hospital Area: [facility] [[id number]]  Level of Care: Med-Surg [16]  May place patient in observation at [facility] or [facility] if equivalent level of care is available:: No  Covid Evaluation: Asymptomatic - no recent exposure (last 10 days) testing not required  Diagnosis: Pneumothorax [696789]  Admitting Physician: TRAUMA MD [[id number]]  Attending Physician: TRAUMA MD [[id number]]  Bed request comments: 6n          B Medical/Surgery History Past Medical History:  Diagnosis Date   Anemia    Anxiety    Asthma    De Quervain's disease (radial styloid tenosynovitis)    Depression    IBS (irritable bowel syndrome)    TMJ (dislocation of temporomandibular joint)    History reviewed. No pertinent surgical history.   A IV Location/Drains/Wounds Patient Lines/Drains/Airways Status     Active Line/Drains/Airways     None            Intake/Output Last 24 hours No intake or output data in the 24 hours ending 08/22/21 2334  Labs/Imaging No results found for this or any previous visit (from the past 48 hour(s)). DG Chest 2 View  Result Date: 08/22/2021 CLINICAL DATA:  Pneumothorax EXAM: CHEST - 2  VIEW COMPARISON:  6:35 p.m. FINDINGS: Moderate right pneumothorax demonstrating both apical and lateral components is unchanged from prior examination. No associated hyperexpansion of the right hemithorax to suggest tension physiology. No pneumothorax on the left. No pleural effusion. Lungs are clear. Cardiac size within normal limits. No acute bone abnormality. IMPRESSION: Stable moderate right pneumothorax. No evidence of tension physiology. Electronically Signed   By: Helyn Numbers M.D.   On: 08/22/2021 22:21   DG Chest 2 View  Result Date: 08/22/2021 CLINICAL DATA:  Pneumothorax EXAM: CHEST - 2 VIEW COMPARISON:  Chest x-ray from the same day. FINDINGS: Stable small to moderate right apical pneumothorax. No consolidation. No visible pleural effusions. Cardiomediastinal silhouette is unchanged and within normal limits. No acute osseous abnormality. IMPRESSION: Stable small to moderate right apical pneumothorax. Electronically Signed   By: Feliberto Harts M.D.   On: 08/22/2021 18:46   DG Chest 2 View  Result Date: 08/22/2021 CLINICAL DATA:  Follow-up pneumothorax EXAM: CHEST - 2 VIEW COMPARISON:  Same day chest x-ray dated August 22, 2021 obtained at 2:26 p.m. FINDINGS: Heart size and mediastinal contours are within normal limits. Both lungs are clear. Stable small right apical pneumothorax. IMPRESSION: Stable small right apical pneumothorax. Electronically Signed   By: Allegra Lai M.D.   On: 08/22/2021 16:12    Pending Labs [employer] (From admission, onward)     Start     Ordered   Signed and Held  HIV Antibody (routine testing w rflx)  (HIV Antibody (Routine testing w reflex) panel)  Once,   R        Signed and Held            Vitals/Pain Today's Vitals   08/22/21 2030 08/22/21 2100 08/22/21 2130 08/22/21 2257  BP: 118/78 120/76 114/65 123/76  Pulse: 84 78 84 72  Resp: 12 13 17 11   Temp:      TempSrc:      SpO2: 100% 100% 99% 100%  Weight:      Height:      PainSc:         Isolation Precautions No active isolations  Medications Medications - No data to display  Mobility walks Low fall risk   Focused Assessments Pulmonary Assessment Handoff:  Lung sounds: L Breath Sounds: Clear R Breath Sounds: Diminished, Clear O2 Device: Room Air      R Recommendations: See Admitting Provider Note  Report given to:   Additional Notes:

## 2021-08-22 NOTE — ED Provider Triage Note (Signed)
Emergency Medicine Provider Triage Evaluation Note  Mahsa Hanser , a 46 y.o. female  was evaluated in triage.  Pt complains of collapsed lung.  Patient states that she had an injection into her right shoulder this morning.  States that afterward she began having chest pain and shortness of breath.  She then had an x-ray done which showed a right apical pneumothorax.  She states that since then she has had worsening symptoms in her provider instructed her to present to the emergency department.  She stated initially she was only having upper back pain but is now having pain in the lower chest on the right side.  She denies any lightheadedness or dizziness.  She is having shortness of breath.  Review of Systems  Positive:  Negative:   Physical Exam  BP (!) 133/91 (BP Location: Left Arm)   Pulse 81   Temp 98.3 F (36.8 C) (Oral)   Resp 17   SpO2 100%  Gen:   Awake, no distress   Resp:  Not tachypneic but taking shallow breaths.  Pleuritic pain with respirations. MSK:   Moves extremities without difficulty  Other:    Medical Decision Making  Medically screening exam initiated at 3:28 PM.  Appropriate orders placed.  Velna Hedgecock was informed that the remainder of the evaluation will be completed by another provider, this initial triage assessment does not replace that evaluation, and the importance of remaining in the ED until their evaluation is complete.     Cristopher Peru, PA-C 08/22/21 1530

## 2021-08-22 NOTE — H&P (Signed)
CC: traumatic pneumothorax  HPI: Kendra Berger is an 46 y.o. female whom was undergoing R shoulder trigger point injection at [facility] this morning.  Following this injection, she reported significant chest wall pain particularly in her back with some radiation anteriorly.  She felt a heaviness type of sensation.   A chest x-ray was performed that demonstrated findings concerning for possible right-sided small/apical pneumothorax.  She was instructed to come to the ER.  She underwent a repeat chest x-ray in the emergency room which showed over 2-hour period, a stable small right apical pneumothorax.  She currently feels well.  She denies any shortness of breath or air hunger.  She denies any worsening or progressive type symptoms.  She does have some pleuritic type discomfort when she takes a deep breath but nothing when she is breathing quietly.  Her husband is at bedside  Past Medical History:  Diagnosis Date   Anemia    Anxiety    Asthma    De Quervain's disease (radial styloid tenosynovitis)    Depression    IBS (irritable bowel syndrome)    TMJ (dislocation of temporomandibular joint)     History reviewed. No pertinent surgical history.  Family History  Problem Relation Age of Onset   Diabetes Father        several relatives on fathers side   Bladder Cancer Maternal Grandmother     Social:  reports that she has never smoked. She has never used smokeless tobacco. She reports current alcohol use of about 1.0 standard drink of alcohol per week. No history on file for drug use.  Allergies:  Allergies  Allergen Reactions   Prednisone     Medications: I have reviewed the patient's current medications.  No results found for this or any previous visit (from the past 48 hour(s)).  DG Chest 2 View  Result Date: 08/22/2021 CLINICAL DATA:  Pneumothorax EXAM: CHEST - 2 VIEW COMPARISON:  6:35 p.m. FINDINGS: Moderate right pneumothorax demonstrating both apical and lateral  components is unchanged from prior examination. No associated hyperexpansion of the right hemithorax to suggest tension physiology. No pneumothorax on the left. No pleural effusion. Lungs are clear. Cardiac size within normal limits. No acute bone abnormality. IMPRESSION: Stable moderate right pneumothorax. No evidence of tension physiology. Electronically Signed   By: Helyn Numbers M.D.   On: 08/22/2021 22:21   DG Chest 2 View  Result Date: 08/22/2021 CLINICAL DATA:  Pneumothorax EXAM: CHEST - 2 VIEW COMPARISON:  Chest x-ray from the same day. FINDINGS: Stable small to moderate right apical pneumothorax. No consolidation. No visible pleural effusions. Cardiomediastinal silhouette is unchanged and within normal limits. No acute osseous abnormality. IMPRESSION: Stable small to moderate right apical pneumothorax. Electronically Signed   By: Feliberto Harts M.D.   On: 08/22/2021 18:46   DG Chest 2 View  Result Date: 08/22/2021 CLINICAL DATA:  Follow-up pneumothorax EXAM: CHEST - 2 VIEW COMPARISON:  Same day chest x-ray dated August 22, 2021 obtained at 2:26 p.m. FINDINGS: Heart size and mediastinal contours are within normal limits. Both lungs are clear. Stable small right apical pneumothorax. IMPRESSION: Stable small right apical pneumothorax. Electronically Signed   By: Allegra Lai M.D.   On: 08/22/2021 16:12    ROS - all of the below systems have been reviewed with the patient and positives are indicated with bold text General: chills, fever or night sweats Eyes: blurry vision or double vision ENT: epistaxis or sore throat Allergy/Immunology: itchy/watery eyes or nasal congestion  Hematologic/Lymphatic: bleeding problems, blood clots or swollen lymph nodes Endocrine: temperature intolerance or unexpected weight changes Breast: new or changing breast lumps or nipple discharge Resp: cough, shortness of breath, or wheezing CV: chest pain or dyspnea on exertion GI: as per HPI GU: dysuria,  trouble voiding, or hematuria MSK: joint pain or joint stiffness Neuro: TIA or stroke symptoms Derm: pruritus and skin lesion changes Psych: anxiety and depression  PE Blood pressure 123/76, pulse 72, temperature 98.1 F (36.7 C), temperature source Oral, resp. rate 11, height 5\' 5"  (1.651 m), weight 61.2 kg, last menstrual period 08/08/2021, SpO2 100 %. Constitutional: NAD; conversant Eyes: Moist conjunctiva; anicteric Neck: Trachea midline Lungs: Normal respiratory effort CV: RRR Psychiatric: Appropriate affect; alert and oriented x3  No results found for this or any previous visit (from the past 48 hour(s)).  DG Chest 2 View  Result Date: 08/22/2021 CLINICAL DATA:  Pneumothorax EXAM: CHEST - 2 VIEW COMPARISON:  6:35 p.m. FINDINGS: Moderate right pneumothorax demonstrating both apical and lateral components is unchanged from prior examination. No associated hyperexpansion of the right hemithorax to suggest tension physiology. No pneumothorax on the left. No pleural effusion. Lungs are clear. Cardiac size within normal limits. No acute bone abnormality. IMPRESSION: Stable moderate right pneumothorax. No evidence of tension physiology. Electronically Signed   By: 10/22/2021 M.D.   On: 08/22/2021 22:21   DG Chest 2 View  Result Date: 08/22/2021 CLINICAL DATA:  Pneumothorax EXAM: CHEST - 2 VIEW COMPARISON:  Chest x-ray from the same day. FINDINGS: Stable small to moderate right apical pneumothorax. No consolidation. No visible pleural effusions. Cardiomediastinal silhouette is unchanged and within normal limits. No acute osseous abnormality. IMPRESSION: Stable small to moderate right apical pneumothorax. Electronically Signed   By: 10/22/2021 M.D.   On: 08/22/2021 18:46   DG Chest 2 View  Result Date: 08/22/2021 CLINICAL DATA:  Follow-up pneumothorax EXAM: CHEST - 2 VIEW COMPARISON:  Same day chest x-ray dated August 22, 2021 obtained at 2:26 p.m. FINDINGS: Heart size and mediastinal  contours are within normal limits. Both lungs are clear. Stable small right apical pneumothorax. IMPRESSION: Stable small right apical pneumothorax. Electronically Signed   By: August 24, 2021 M.D.   On: 08/22/2021 16:12    I have personally reviewed the relevant CXR x 3 from today  A/P: Kendra Berger is an 46 y.o. female with small traumatic right apical pneumothorax following a trigger point injection with orthopedics earlier today.   -We spent time discussing what her pneumothorax is with her as well as lung mechanics.  Currently her pneumothorax is rather small and this was due to a pinpoint type injury from the needle.  She has no dyspnea or shortness of breath saturations nearing 100% on room air.  We discussed options moving forward.  She would like to do everything she can to avoid staying in the hospital overnight if at all possible.  She reports that her husband will be with her at home tonight and that they live approximately 5 minutes from the hospital.  Therefore, we discussed options including a repeat chest x-ray 6 to 8 hours after her initial x-ray to ensure stability.  If there is no evident worsening, we discussed close interval follow-up with ER warning precautions should symptoms present and/or worsen.  I do think having a repeat chest x-ray in the next 5 to 7 days would be a good idea to ensure that this is a resolving process as well.  -She would feel most  comfortable with observation overnight which I agree would be best and we will plan a repeat film in AM to reassess things.  I spent a total of 55 minutes in both face-to-face and non-face-to-face activities, excluding procedures performed, for this visit on the date of this encounter.   Marin Olp, MD [facility], [facility]

## 2021-08-22 NOTE — ED Provider Notes (Signed)
  [facility] EMERGENCY DEPARTMENT Provider Note   CSN: [id number] Arrival date & time: 08/22/21  1520     History {Add pertinent medical, surgical, social history, OB history to HPI:1} Chief Complaint  Patient presents with   Pneumothorax    Kendra Berger is a 46 y.o. female.  With PMH of DVT, anxiety, asthma, IBS who presents with pneumothorax after trigger point injection of the shoulder earlier this morning by orthopedics. Pt sent from ortho after trigger point injection of shoulder this morning. Developed increasing CP and shob.  Xray showed pneumo on the right . Shallow breaths.  States symptoms are worse than when they started.  HPI     Home Medications Prior to Admission medications   Medication Sig Start Date End Date Taking? Authorizing Provider  buPROPion (WELLBUTRIN XL) 150 MG 24 hr tablet Take 300 mg by mouth daily.     [provider]  ibuprofen (ADVIL,MOTRIN) 200 MG tablet Take 200 mg by mouth every 6 (six) hours as needed for moderate pain.    [provider]  loratadine (ALLERGY) 10 MG tablet Take 10 mg by mouth daily.    [provider]      Allergies    Prednisone    Review of Systems   Review of Systems  Physical Exam Updated Vital Signs BP 124/77   Pulse 75   Temp 98.3 F (36.8 C) (Oral)   Resp 11   LMP 08/08/2021 (Approximate)   SpO2 100%  Physical Exam  ED Results / Procedures / Treatments   Labs (all labs ordered are listed, but only abnormal results are displayed) Labs Reviewed - No data to display  EKG None  Radiology DG Chest 2 View  Result Date: 08/22/2021 CLINICAL DATA:  Follow-up pneumothorax EXAM: CHEST - 2 VIEW COMPARISON:  Same day chest x-ray dated August 22, 2021 obtained at 2:26 p.m. FINDINGS: Heart size and mediastinal contours are within normal limits. Both lungs are clear. Stable small right apical pneumothorax. IMPRESSION: Stable small right apical pneumothorax.  Electronically Signed   By: Allegra Lai M.D.   On: 08/22/2021 16:12    Procedures Procedures  {Document cardiac monitor, telemetry assessment procedure when appropriate:1}  Medications Ordered in ED Medications - No data to display  ED Course/ Medical Decision Making/ A&P                           Medical Decision Making  ***  {Document critical care time when appropriate:1} {Document review of labs and clinical decision tools ie heart score, Chads2Vasc2 etc:1}  {Document your independent review of radiology images, and any outside records:1} {Document your discussion with family members, caretakers, and with consultants:1} {Document social determinants of health affecting pt's care:1} {Document your decision making why or why not admission, treatments were needed:1} Final Clinical Impression(s) / ED Diagnoses Final diagnoses:  None    Rx / DC Orders ED Discharge Orders     None

## 2021-08-22 NOTE — ED Notes (Signed)
Pt c/o shortness of breath and chest pain following cortisone injections in the right should today. Pt VSS. Pt H&P obtained. Call bell in reach. Awaiting provider disposition.

## 2021-08-22 NOTE — ED Notes (Signed)
Patient is resting comfortably. 

## 2021-08-22 NOTE — ED Triage Notes (Signed)
Pt sent from ortho after trigger point injection of shoulder this morning. Developed increasing CP and shob.  Xray showed pneumo on the right . Shallow breaths.  States symptoms are worse than when they started.

## 2021-08-22 NOTE — Consult Note (Signed)
CC/Reason for consult: Pneumothorax  Requesting physician: Dr. Vivien Rossetti  HPI: Kendra Berger is an 46 y.o. female whom was undergoing R shoulder trigger point injection at [facility] this morning.  Following this injection, she reported significant chest wall pain particularly in her back with some radiation anteriorly.  She felt a heaviness type of sensation.  A chest x-ray was performed that demonstrated findings concerning for possible right-sided small/apical pneumothorax.  She was instructed to come to the ER.  She underwent a repeat chest x-ray in the emergency room which showed over 2-hour period, a stable small right apical pneumothorax.  She currently feels well.  She denies any shortness of breath or air hunger.  She denies any worsening or progressive type symptoms.  She does have some pleuritic type discomfort when she takes a deep breath but nothing when she is breathing quietly.  Her husband is at bedside  Past Medical History:  Diagnosis Date   Anemia    Anxiety    Asthma    De Quervain's disease (radial styloid tenosynovitis)    Depression    IBS (irritable bowel syndrome)    TMJ (dislocation of temporomandibular joint)     No past surgical history on file.  Family History  Problem Relation Age of Onset   Diabetes Father        several relatives on fathers side   Bladder Cancer Maternal Grandmother     Social:  reports that she has never smoked. She has never used smokeless tobacco. She reports current alcohol use of about 1.0 standard drink of alcohol per week. No history on file for drug use.  Allergies:  Allergies  Allergen Reactions   Prednisone     Medications: I have reviewed the patient's current medications.  No results found for this or any previous visit (from the past 48 hour(s)).  DG Chest 2 View  Result Date: 08/22/2021 CLINICAL DATA:  Pneumothorax EXAM: CHEST - 2 VIEW COMPARISON:  Chest x-ray from the same day. FINDINGS: Stable  small to moderate right apical pneumothorax. No consolidation. No visible pleural effusions. Cardiomediastinal silhouette is unchanged and within normal limits. No acute osseous abnormality. IMPRESSION: Stable small to moderate right apical pneumothorax. Electronically Signed   By: Feliberto Harts M.D.   On: 08/22/2021 18:46   DG Chest 2 View  Result Date: 08/22/2021 CLINICAL DATA:  Follow-up pneumothorax EXAM: CHEST - 2 VIEW COMPARISON:  Same day chest x-ray dated August 22, 2021 obtained at 2:26 p.m. FINDINGS: Heart size and mediastinal contours are within normal limits. Both lungs are clear. Stable small right apical pneumothorax. IMPRESSION: Stable small right apical pneumothorax. Electronically Signed   By: Allegra Lai M.D.   On: 08/22/2021 16:12    ROS - all of the below systems have been reviewed with the patient and positives are indicated with bold text General: chills, fever or night sweats Eyes: blurry vision or double vision ENT: epistaxis or sore throat Allergy/Immunology: itchy/watery eyes or nasal congestion Hematologic/Lymphatic: bleeding problems, blood clots or swollen lymph nodes Endocrine: temperature intolerance or unexpected weight changes Breast: new or changing breast lumps or nipple discharge Resp: cough, shortness of breath, or wheezing CV: chest pain or dyspnea on exertion GI: as per HPI GU: dysuria, trouble voiding, or hematuria MSK: joint pain or joint stiffness Neuro: TIA or stroke symptoms Derm: pruritus and skin lesion changes Psych: anxiety and depression  PE Blood pressure 137/82, pulse 89, temperature 98.3 F (36.8 C), temperature source Oral, resp. rate  15, last menstrual period 08/08/2021, SpO2 100 %. Constitutional: NAD; conversant Eyes: Moist conjunctiva; anicteric Neck: Trachea midline Lungs: Normal respiratory effort CV: RRR Psychiatric: Appropriate affect; alert and oriented x3  No results found for this or any previous visit (from the  past 48 hour(s)).  DG Chest 2 View  Result Date: 08/22/2021 CLINICAL DATA:  Pneumothorax EXAM: CHEST - 2 VIEW COMPARISON:  Chest x-ray from the same day. FINDINGS: Stable small to moderate right apical pneumothorax. No consolidation. No visible pleural effusions. Cardiomediastinal silhouette is unchanged and within normal limits. No acute osseous abnormality. IMPRESSION: Stable small to moderate right apical pneumothorax. Electronically Signed   By: Feliberto Harts M.D.   On: 08/22/2021 18:46   DG Chest 2 View  Result Date: 08/22/2021 CLINICAL DATA:  Follow-up pneumothorax EXAM: CHEST - 2 VIEW COMPARISON:  Same day chest x-ray dated August 22, 2021 obtained at 2:26 p.m. FINDINGS: Heart size and mediastinal contours are within normal limits. Both lungs are clear. Stable small right apical pneumothorax. IMPRESSION: Stable small right apical pneumothorax. Electronically Signed   By: Allegra Lai M.D.   On: 08/22/2021 16:12    I have personally reviewed the relevant CXR x2 from today  A/P: Kendra Berger is an 46 y.o. female with small traumatic right apical pneumothorax following a trigger point injection with orthopedics earlier today.  -We spent time discussing what her pneumothorax is with her as well as lung mechanics.  Currently her pneumothorax is rather small and this was due to a pinpoint type injury from the needle.  She has no dyspnea or shortness of breath saturations nearing 100% on room air.  We discussed options moving forward.  She would like to do everything she can to avoid staying in the hospital overnight if at all possible.  She reports that her husband will be with her at home tonight and that they live approximately 5 minutes from the hospital.  Therefore, we discussed options including a repeat chest x-ray 6 to 8 hours after her initial x-ray to ensure stability.  If there is no evident worsening, we discussed close interval follow-up with ER warning precautions should  symptoms present and/or worsen.  I do think having a repeat chest x-ray in the next 5 to 7 days would be a good idea to ensure that this is a resolving process as well.  -Discussed plan with Dr. Elpidio Anis  -If interval worsening of her pneumothorax on her follow-up imaging, we discussed possibility of chest tube placement and what that would entail.  I spent a total of 60 minutes in both face-to-face and non-face-to-face activities, excluding procedures performed, for this visit on the date of this encounter.  Marin Olp, MD [facility], [facility]

## 2021-08-22 NOTE — Telephone Encounter (Signed)
Patient called stating that she has gotten worse since receiving injection this morning and is having chest pain and a sharpe pain when inhaling.  Would like a call back from [place] to discuss.  Cb# [phone].  Please advise.  Thank you.

## 2021-08-23 ENCOUNTER — Observation Stay (HOSPITAL_COMMUNITY): Payer: BC Managed Care – PPO

## 2021-08-23 ENCOUNTER — Encounter (HOSPITAL_COMMUNITY): Payer: Self-pay

## 2021-08-23 DIAGNOSIS — S270XXA Traumatic pneumothorax, initial encounter: Secondary | ICD-10-CM | POA: Diagnosis not present

## 2021-08-23 DIAGNOSIS — J939 Pneumothorax, unspecified: Secondary | ICD-10-CM | POA: Diagnosis not present

## 2021-08-23 MED ORDER — MELATONIN 3 MG PO TABS: 3.0000 mg | ORAL_TABLET | Freq: Every evening | ORAL | Status: DC | PRN

## 2021-08-23 MED ORDER — OXYCODONE HCL 5 MG PO TABS: 5.0000 mg | ORAL_TABLET | Freq: Four times a day (QID) | ORAL | Status: DC | PRN

## 2021-08-23 MED ORDER — SODIUM CHLORIDE 0.9% FLUSH: 3.0000 mL | INTRAVENOUS | Status: DC | PRN

## 2021-08-23 MED ORDER — ENOXAPARIN SODIUM 30 MG/0.3ML IJ SOSY
30.0000 mg | PREFILLED_SYRINGE | Freq: Two times a day (BID) | INTRAMUSCULAR | Status: DC
  Filled 2021-08-23: qty 0.3

## 2021-08-23 MED ORDER — ONDANSETRON HCL 4 MG/2ML IJ SOLN: 4.0000 mg | Freq: Four times a day (QID) | INTRAMUSCULAR | Status: DC | PRN

## 2021-08-23 MED ORDER — IBUPROFEN 400 MG PO TABS: 600.0000 mg | ORAL_TABLET | Freq: Four times a day (QID) | ORAL | Status: DC | PRN

## 2021-08-23 MED ORDER — HYDRALAZINE HCL 20 MG/ML IJ SOLN: 10.0000 mg | INTRAMUSCULAR | Status: DC | PRN

## 2021-08-23 MED ORDER — ACETAMINOPHEN 500 MG PO TABS
1000.0000 mg | ORAL_TABLET | Freq: Four times a day (QID) | ORAL | Status: DC
  Administered 2021-08-23 (×2): 1000 mg via ORAL
  Filled 2021-08-23 (×2): qty 2

## 2021-08-23 MED ORDER — SODIUM CHLORIDE 0.9% FLUSH
3.0000 mL | Freq: Two times a day (BID) | INTRAVENOUS | Status: DC
  Administered 2021-08-23 (×2): 3 mL via INTRAVENOUS

## 2021-08-23 MED ORDER — ACETAMINOPHEN 500 MG PO TABS: 1000.0000 mg | ORAL_TABLET | Freq: Four times a day (QID) | ORAL | 0 refills | Status: AC

## 2021-08-23 MED ORDER — ONDANSETRON 4 MG PO TBDP: 4.0000 mg | ORAL_TABLET | Freq: Four times a day (QID) | ORAL | Status: DC | PRN

## 2021-08-23 MED ORDER — SODIUM CHLORIDE 0.9 % IV SOLN: 250.0000 mL | INTRAVENOUS | Status: DC | PRN

## 2021-08-23 NOTE — Progress Notes (Signed)
Pt discharged home in stable condition 

## 2021-08-23 NOTE — TOC Transition Note (Signed)
Transition of Care [facility]) - CM/SW Discharge Note   Patient Details  Name: Kendra Berger MRN: [id number] Date of Birth: [birth year]  [facility] CM/SW Contact:  Bess Kinds, RN Phone Number: [phone] 08/23/2021, 9:56 AM   Clinical Narrative:     Patient to transition home today. No TOC needs identified.   Final next level of care: Home/Self Care Barriers to Discharge: No Barriers Identified   Patient Goals and CMS Choice        Discharge Placement                       Discharge Plan and Services                                     Social Determinants of Health (SDOH) Interventions     Readmission Risk Interventions     No data to display

## 2021-08-23 NOTE — Discharge Summary (Signed)
Physician Discharge Summary  Patient ID: Kendra Berger MRN: [id number] DOB/AGE: [birth year] 46 y.o.  Admit date: 08/22/2021 Discharge date: 08/23/2021  Admission Diagnoses:R pneumothorax  Discharge Diagnoses: R pneumothorax Principal Problem:   Pneumothorax   Discharged Condition: good  Hospital Course: Kendra Berger is an 46 y.o. female whom was undergoing R shoulder trigger point injection at [facility] by Karenann Cai, PA-C 8/4.  Following this injection, she reported significant chest wall pain particularly in her back with some radiation anteriorly.  She felt a heaviness type of sensation.   A chest x-ray was performed that demonstrated findings concerning for possible right-sided small/apical pneumothorax.  She was instructed to come to the ER.  She underwent a repeat chest x-ray in the emergency room which showed over 2-hour period, a stable small right apical pneumothorax.   She was admitted for observation to the Trauma Service. CXR initially was stable then showed some  improvement in R PTX this AM so she is ready for D/C.  I spoke at length with her and her husband regarding signs and symptoms to look for and to return if she has any increasing shortness of breath or right-sided chest pain.  She will follow-up in 1 week at [facility] care for follow-up chest x-ray.  Consults: None  Significant Diagnostic Studies: Above  Treatments: Pain control and serial imaging  Discharge Exam: Blood pressure 115/71, pulse 63, temperature 98.2 F (36.8 C), temperature source Oral, resp. rate 17, height 5\' 5"  (1.651 m), weight 61.2 kg, last menstrual period 08/08/2021, SpO2 100 %. General appearance: alert and cooperative Back: Band-Aid on injection site near right scapula Resp: clear to auscultation bilaterally Cardio: regular rate and rhythm GI: Benign  Disposition: Discharge disposition: 01-Home or Self Care       Discharge Instructions     Diet - low sodium heart  healthy   Complete by: As directed    Discharge instructions   Complete by: As directed    Return if increased R chest pain or SOB   Increase activity slowly   Complete by: As directed       Allergies as of 08/23/2021       Reactions   Prednisone         Medication List     TAKE these medications    acetaminophen 500 MG tablet Commonly known as: TYLENOL Take 2 tablets (1,000 mg total) by mouth every 6 (six) hours.   Allergy 10 MG tablet Generic drug: loratadine Take 10 mg by mouth daily.   buPROPion 150 MG 24 hr tablet Commonly known as: WELLBUTRIN XL Take 300 mg by mouth daily.   ibuprofen 200 MG tablet Commonly known as: ADVIL Take 200 mg by mouth every 6 (six) hours as needed for moderate pain.        Follow-up Information     [place], 10/23/2021, PA-C. Schedule an appointment as soon as possible for a visit in 1 week(s).   Specialty: Orthopedic Surgery Why: for F/U CXR Contact information: [address] [place] [phone]                 Signed: [phone] 08/23/2021, 9:47 AM

## 2021-08-25 ENCOUNTER — Telehealth: Payer: Self-pay | Admitting: Surgical

## 2021-08-25 ENCOUNTER — Encounter: Payer: Self-pay | Admitting: Orthopedic Surgery

## 2021-08-25 MED ORDER — METHYLPREDNISOLONE ACETATE 40 MG/ML IJ SUSP
20.0000 mg | INTRAMUSCULAR | Status: AC | PRN
  Administered 2021-08-22: 20 mg via INTRAMUSCULAR

## 2021-08-25 MED ORDER — BUPIVACAINE HCL 0.25 % IJ SOLN
2.0000 mL | INTRAMUSCULAR | Status: AC | PRN
  Administered 2021-08-22: 2 mL

## 2021-08-25 NOTE — Telephone Encounter (Signed)
I called patient today to check in on how she was feeling following her period of observation in the hospital.  She states that her shortness of breath and chest pain have improved but she does feel fatigued.  She talked with Dr. Janee Morn on Saturday morning who recommended that she return to our clinic for repeat chest x-ray to evaluate for continued resolution of her pneumothorax.  Appointment was made for her to return for repeat chest x-ray and evaluation at 8:15 AM on Friday morning this week.  She will reach out to the office or call my cell number with any concerns in the meantime.

## 2021-08-25 NOTE — Progress Notes (Signed)
Office Visit Note   Patient: Kendra Berger           Date of Birth: Aug 28, 1975           MRN: [id number] Visit Date: 08/22/2021 Requested by: Kendrick Ranch, MD [address] [place],  [place] [address] PCP: Kendrick Ranch, MD  Subjective: Chief Complaint  Patient presents with   Right Shoulder - Pain    HPI: Kendra Berger is a 46 y.o. female who presents to the office complaining of right shoulder pain.  Patient returns after right shoulder glenohumeral injection on 07/28/2021 that gave her about 50 to 60% improvement for 1 week.  She notes continued "muscular" pain in her scapular region.  She has been doing some abduction and external rotation exercises with some improvement of her symptoms.  She does note continued pain that occasionally travels down the arm and numbness and tingling in the fourth and fifth fingers.  Most of her pain is localized to the posterior shoulder and scapula..                ROS: All systems reviewed are negative as they relate to the chief complaint within the history of present illness.  Patient denies fevers or chills.  Assessment & Plan: Visit Diagnoses:  1. Right shoulder pain, unspecified chronicity   2. Trigger point of shoulder region, right   3. Shortness of breath     Plan: Patient is a 46 year old female who presents for repeat evaluation of right shoulder pain.  She did have good relief of pain for a short period of time from the glenohumeral injection that she had at her last appointment.  Unfortunately it only lasted for 1 week.  She continues to have scapular pain with no improvement from the glenohumeral injection.  Discussed the options available to patient as next step which would primarily be right shoulder MRI arthrogram for further evaluation of shoulder pathology given that she had moderate relief from a lot of her pain with glenohumeral injection versus cervical spine radiographs and work-up of  C-spine versus formal physical therapy for right shoulder rotator cuff strengthening exercises and scapular exercises versus trigger point injections today in the clinic.  After discussion of options, patient feels very strongly that her symptoms are muscular in nature and she would like to try trigger point injections before trying any other option.  Under ultrasound guidance, out of plane technique was utilized to deliver injection into the 2 trigger points that were identified on physical exam.  Patient tolerated the procedure well and did not notice any discomfort or symptoms during the procedure or immediately afterward.  She will call the office in 2 weeks to see how much relief she has had from trigger point injections and determine whether or not she will need further intervention such as MRI/C-spine radiographs/formal PT.  Update for afternoon of 08/22/2021: Patient contacted the office complaining of some shortness of breath and pleuritic chest pain following a walk with her dog.  She was brought back into the clinic where she had vital signs checked which showed oxygen saturation of 98%, heart rate of 88, blood pressure 145/96.  She felt well overall with no acute distress and no shortness of breath at rest but did have some pain with deep breaths and did state that she had shortness of breath with exertion such as when she was walking her dog.  She had chest x-ray that was checked that afternoon in  this clinic and was read by myself to show small pneumothorax in the apex of the right lung.  This finding and scenario was discussed with general surgery who recommended the patient report to the emergency department for further evaluation.  Discussed this with the patient who agreed with plan.   Follow-Up Instructions: No follow-ups on file.   Orders:  Orders Placed This Encounter  Procedures   US Guided Needle Placement - No Linked Charges   XR Chest 2 View   No orders of the defined types were  placed in this encounter.     Procedures: Trigger Point Inj  Date/Time: 08/22/2021 10:00 AM  Performed by: Julieanne Cotton, PA-C Authorized by: Julieanne Cotton, PA-C   Consent Given by:  Patient Site marked: the procedure site was marked   Timeout: prior to procedure the correct patient, procedure, and site was verified   Indications:  Muscle spasm and pain Total # of Trigger Points:  2 Location: back and shoulder   Needle Size:  25 G Medications #1:  2 mL bupivacaine 0.25 %; 20 mg methylPREDNISolone acetate 40 MG/ML Medications #2:  2 mL bupivacaine 0.25 %; 20 mg methylPREDNISolone acetate 40 MG/ML Patient tolerance:  Patient tolerated the procedure well with no immediate complications    Clinical Data: No additional findings.  Objective: Vital Signs: There were no vitals taken for this visit.  Physical Exam:  Constitutional: Patient appears well-developed HEENT:  Head: Normocephalic Eyes:EOM are normal Neck: Normal range of motion Cardiovascular: Normal rate Pulmonary/chest: Effort normal Neurologic: Patient is alert Skin: Skin is warm Psychiatric: Patient has normal mood and affect  Ortho Exam: Ortho exam demonstrates right shoulder with 75 degrees external rotation, 115 degrees abduction, 180 degrees forward flexion.  She has moderate tenderness over the bicipital groove.  No tenderness over the [facility] joint.  5/5 motor strength of supra, infra, subscap of the right shoulder.  5/5 motor strength of bilateral grip strength, finger abduction, pronation/supination, bicep, tricep, deltoid.  She has 2 pinpoint areas of tenderness maximally in the posterior aspect of the shoulder along the medial border of the scapula as well as the lateral border of the scapula.  Axillary nerve intact with deltoid firing.  No significant pain with cervical spine range of motion.  No tenderness throughout the axial cervical spine.  Specialty Comments:  No specialty comments  available.  Imaging: No results found.   PMFS History: Patient Active Problem List   Diagnosis Date Noted   Pneumothorax 08/22/2021   History of DVT of lower extremity 05/17/2017   Raynaud's phenomenon without gangrene 05/17/2017   Anxiety 03/24/2015   Asthma 03/24/2015   De Quervain's tenosynovitis 03/24/2015   IBS (irritable bowel syndrome) 03/24/2015   Insomnia 03/24/2015   Major depression, recurrent (HCC) 03/24/2015   Past Medical History:  Diagnosis Date   Anemia    Anxiety    Asthma    De Quervain's disease (radial styloid tenosynovitis)    Depression    IBS (irritable bowel syndrome)    TMJ (dislocation of temporomandibular joint)     Family History  Problem Relation Age of Onset   Diabetes Father        several relatives on fathers side   Bladder Cancer Maternal Grandmother     No past surgical history on file. Social History   Occupational History   Occupation: Biomedical scientist  Tobacco Use   Smoking status: Never   Smokeless tobacco: Never  Substance and Sexual Activity   Alcohol  use: Yes    Alcohol/week: 1.0 standard drink of alcohol    Types: 1 Standard drinks or equivalent per week   Drug use: Not on file   Sexual activity: Not on file

## 2021-08-29 ENCOUNTER — Ambulatory Visit: Payer: BC Managed Care – PPO | Admitting: Orthopedic Surgery

## 2021-08-29 ENCOUNTER — Encounter: Payer: Self-pay | Admitting: Orthopedic Surgery

## 2021-08-29 ENCOUNTER — Ambulatory Visit (INDEPENDENT_AMBULATORY_CARE_PROVIDER_SITE_OTHER): Payer: BC Managed Care – PPO | Admitting: Orthopedic Surgery

## 2021-08-29 ENCOUNTER — Ambulatory Visit (INDEPENDENT_AMBULATORY_CARE_PROVIDER_SITE_OTHER): Payer: BC Managed Care – PPO

## 2021-08-29 DIAGNOSIS — R0602 Shortness of breath: Secondary | ICD-10-CM

## 2021-08-29 DIAGNOSIS — M25511 Pain in right shoulder: Secondary | ICD-10-CM

## 2021-08-29 NOTE — Progress Notes (Signed)
Office Visit Note   Patient: Kendra Berger           Date of Birth: 1975/05/05           MRN: [id number] Visit Date: 08/29/2021 Requested by: Kendrick Ranch, MD [address] [place],  [place] [address] PCP: Hyacinth Meeker, [place], [place]  Subjective: Chief Complaint  Patient presents with   Other    F/u CXR    HPI: Kendra Berger is a 46 year old patient with right shoulder and trigger point pain.  She had a trigger point injection last week which resulted in a pneumothorax.  She is here for follow-up chest x-ray as well as to continue the work-up for her shoulder pain.  Notably she had frozen shoulder 3 years ago at which time Dr. Burlene Arnt performed arthroscopy and manipulation.  She has had pain since that intervention.  She feels like the pain is posterior and primarily muscular.  She had 1 cortisone injection after surgery which helped her for about 2 weeks.  Dry needling has helped her on occasion.  Does report occasional numbness in digits 4 and 5 but not much in the way of neck pain.  An abduction injury with a machine weight initiated her symptoms.  Hard for her to sleep on the right-hand side.  The pain does wake her from sleep.  The patient denies any acute chest pain or shortness of breath but does report that her exercise and walking tolerance is diminished.              ROS: All systems reviewed are negative as they relate to the chief complaint within the history of present illness.  Patient denies  fevers or chills.   Assessment & Plan: Visit Diagnoses:  1. Shortness of breath   2. Right shoulder pain, unspecified chronicity     Plan: Impression is diminishing right-sided pneumothorax but still with some clinical symptoms.  May take another 2 to 4 weeks for this to normalize to allow her to return to exertional activities.  Plan is to week return with repeat radiographs to document full expansion of her lungs.  I think she is fine to walk the dogs but would not  exert any more than that until her radiographs normalized.  Regarding her shoulder pain she still is having atypical symptoms which could be labral in origin versus rotator cuff versus radiculopathy.  Right now it is unclear which of those has been giving her symptoms now for the past several months.  Plan MRI arthrogram right shoulder to evaluate the labrum and rotator cuff.  Follow-up after that study. Follow-Up Instructions: No follow-ups on file.   Orders:  Orders Placed This Encounter  Procedures   XR Chest 2 View   MR SHOULDER RIGHT W CONTRAST   Arthrogram   No orders of the defined types were placed in this encounter.     Procedures: No procedures performed   Clinical Data: No additional findings.  Objective: Vital Signs: LMP 08/08/2021 (Approximate)   Physical Exam:   Constitutional: Patient appears well-developed HEENT:  Head: Normocephalic Eyes:EOM are normal Neck: Normal range of motion Cardiovascular: Normal rate Pulmonary/chest: Effort normal Neurologic: Patient is alert Skin: Skin is warm Psychiatric: Patient has normal mood and affect   Ortho Exam: Ortho exam demonstrates full active and passive range of motion of the right shoulder.  Excellent rotator cuff strength on the right infraspinatus supraspinatus and subscap muscle testing.  Slight mechanical symptoms with internal and  external rotation at 90 degrees of abduction.  O'Brien's testing speeds testing negative on the right and left.  No discrete AC joint tenderness is present.  No atrophy in the shoulder girdle musculature.  Motor or sensory function to the hand intact.  Neck range of motion full.  Specialty Comments:  No specialty comments available.  Imaging: No results found.   PMFS History: Patient Active Problem List   Diagnosis Date Noted   Pneumothorax 08/22/2021   History of DVT of lower extremity 05/17/2017   Raynaud's phenomenon without gangrene 05/17/2017   Anxiety 03/24/2015    Asthma 03/24/2015   De Quervain's tenosynovitis 03/24/2015   IBS (irritable bowel syndrome) 03/24/2015   Insomnia 03/24/2015   Major depression, recurrent (HCC) 03/24/2015   Past Medical History:  Diagnosis Date   Anemia    Anxiety    Asthma    De Quervain's disease (radial styloid tenosynovitis)    Depression    IBS (irritable bowel syndrome)    TMJ (dislocation of temporomandibular joint)     Family History  Problem Relation Age of Onset   Diabetes Father        several relatives on fathers side   Bladder Cancer Maternal Grandmother     No past surgical history on file. Social History   Occupational History   Occupation: Biomedical scientist  Tobacco Use   Smoking status: Never   Smokeless tobacco: Never  Substance and Sexual Activity   Alcohol use: Yes    Alcohol/week: 1.0 standard drink of alcohol    Types: 1 Standard drinks or equivalent per week   Drug use: Not on file   Sexual activity: Not on file

## 2021-09-12 ENCOUNTER — Encounter: Payer: Self-pay | Admitting: Orthopedic Surgery

## 2021-09-12 ENCOUNTER — Ambulatory Visit (INDEPENDENT_AMBULATORY_CARE_PROVIDER_SITE_OTHER): Payer: BC Managed Care – PPO

## 2021-09-12 ENCOUNTER — Telehealth: Payer: Self-pay

## 2021-09-12 ENCOUNTER — Ambulatory Visit (INDEPENDENT_AMBULATORY_CARE_PROVIDER_SITE_OTHER): Payer: BC Managed Care – PPO | Admitting: Orthopedic Surgery

## 2021-09-12 DIAGNOSIS — R0602 Shortness of breath: Secondary | ICD-10-CM

## 2021-09-12 NOTE — Progress Notes (Signed)
Office Visit Note   Patient: Kendra Berger           Date of Birth: 09-09-1975           MRN: [id number] Visit Date: 09/12/2021 Requested by: Collene Mares, PA [address] [place] [address] [place],  [place] [address] PCP: Hyacinth Meeker, [place], [place]  Subjective: Chief Complaint  Patient presents with   Follow-up    HPI: Kendra Berger is a 46 year old patient who was about 3 and half weeks out from pneumothorax from trigger point injection.  Overall she is feeling better.  Still has some shortness of breath when she gets to the top of the stairs.  No longer having any pleuritic type chest pain when she is just walking around.  She has not started running or doing the palatine but she would like to do that as part of her fitness regimen.              ROS: All systems reviewed are negative as they relate to the chief complaint within the history of present illness.  Patient denies  fevers or chills.   Assessment & Plan: Visit Diagnoses:  1. Shortness of breath     Plan: Impression is resolving right pneumothorax.  Plan is okay to start [employer] only when she can go up a flight of stairs without shortness of breath.  That may be 1 to 2 weeks.  When she is comfortable on the palatine I think running would be the next step.  Overall from a radiographic standpoint the pneumothorax looks improved.  Plan is for follow-up after her MRI of the right shoulder which is scheduled for 10/03/2021.  She was able to secure a lower cost option at a facility outside of [facility] imaging and cone.  I did discuss the natural history and treatment plan with one of the trauma surgeons today who is in agreement with the proposed return to activity level.  Risk of recurrent pneumothorax with return to activity exceedingly rare.  Follow-Up Instructions: No follow-ups on file.   Orders:  Orders Placed This Encounter  Procedures   XR Chest 2 View   No orders of the defined types were placed in this  encounter.     Procedures: No procedures performed   Clinical Data: No additional findings.  Objective: Vital Signs: LMP 08/08/2021 (Approximate)   Physical Exam:   Constitutional: Patient appears well-developed HEENT:  Head: Normocephalic Eyes:EOM are normal Neck: Normal range of motion Cardiovascular: Normal rate Pulmonary/chest: Effort normal Neurologic: Patient is alert Skin: Skin is warm Psychiatric: Patient has normal mood and affect   Ortho Exam: Ortho exam is relatively unchanged.  From a lung standpoint she is breathing without difficulty.  Shoulder still has periscapular pain with motion.  Specialty Comments:  No specialty comments available.  Imaging: No results found.   PMFS History: Patient Active Problem List   Diagnosis Date Noted   Pneumothorax 08/22/2021   History of DVT of lower extremity 05/17/2017   Raynaud's phenomenon without gangrene 05/17/2017   Anxiety 03/24/2015   Asthma 03/24/2015   De Quervain's tenosynovitis 03/24/2015   IBS (irritable bowel syndrome) 03/24/2015   Insomnia 03/24/2015   Major depression, recurrent (HCC) 03/24/2015   Past Medical History:  Diagnosis Date   Anemia    Anxiety    Asthma    De Quervain's disease (radial styloid tenosynovitis)    Depression    IBS (irritable bowel syndrome)    TMJ (dislocation of temporomandibular joint)  Family History  Problem Relation Age of Onset   Diabetes Father        several relatives on fathers side   Bladder Cancer Maternal Grandmother     No past surgical history on file. Social History   Occupational History   Occupation: Biomedical scientist  Tobacco Use   Smoking status: Never   Smokeless tobacco: Never  Substance and Sexual Activity   Alcohol use: Yes    Alcohol/week: 1.0 standard drink of alcohol    Types: 1 Standard drinks or equivalent per week   Drug use: Not on file   Sexual activity: Not on file

## 2021-09-12 NOTE — Telephone Encounter (Signed)
Patient seen in the office today. She is scheduled for an arthrogram on 10/03/21.  However, she is asking for this to be scheduled at [facility] facility instead as her insurance told her it would be $500 cheaper for her to have done there. She stated her insurance company also told her that the scan would need prior auth. Patient would like to be called about this ASAP.  [phone]

## 2021-09-12 NOTE — Telephone Encounter (Signed)
Called pt and left vm letting pt know authorization has been done and approved and order has been sent to [facility].

## 2021-10-02 ENCOUNTER — Telehealth: Payer: Self-pay | Admitting: Orthopedic Surgery

## 2021-10-02 NOTE — Telephone Encounter (Signed)
Called patient left message on voicemail to return call to schedule an appointment with Dr. August Saucer for MRI review     MRI done on 10/01/2021

## 2021-10-03 ENCOUNTER — Other Ambulatory Visit: Payer: BC Managed Care – PPO

## 2021-10-21 DIAGNOSIS — E559 Vitamin D deficiency, unspecified: Secondary | ICD-10-CM | POA: Diagnosis not present

## 2021-10-21 DIAGNOSIS — Z862 Personal history of diseases of the blood and blood-forming organs and certain disorders involving the immune mechanism: Secondary | ICD-10-CM | POA: Diagnosis not present

## 2021-10-21 DIAGNOSIS — G4489 Other headache syndrome: Secondary | ICD-10-CM | POA: Diagnosis not present

## 2021-10-21 DIAGNOSIS — Z0000 Encounter for general adult medical examination without abnormal findings: Secondary | ICD-10-CM | POA: Diagnosis not present

## 2021-10-21 DIAGNOSIS — E785 Hyperlipidemia, unspecified: Secondary | ICD-10-CM | POA: Diagnosis not present

## 2021-11-27 ENCOUNTER — Encounter: Payer: Self-pay | Admitting: Orthopedic Surgery

## 2021-11-27 ENCOUNTER — Ambulatory Visit (INDEPENDENT_AMBULATORY_CARE_PROVIDER_SITE_OTHER): Payer: BC Managed Care – PPO | Admitting: Orthopedic Surgery

## 2021-11-27 DIAGNOSIS — M25511 Pain in right shoulder: Secondary | ICD-10-CM | POA: Diagnosis not present

## 2021-11-27 NOTE — Progress Notes (Signed)
Office Visit Note   Patient: Kendra Berger           Date of Birth: 15-Oct-1975           MRN: [id number] Visit Date: 11/27/2021 Requested by: Collene Mares, PA [address] [place] [address] [place],  [place] [address] PCP: Hyacinth Meeker, [place], [place]  Subjective: Chief Complaint  Patient presents with   Other     Scan review    HPI: Kendra Berger is a 46 y.o. female who presents to the office reporting right shoulder pain.  Since she was last seen she had an MRI scan which is reviewed.  Kendra Berger has recovered well from her pneumothorax.  She is able to run.  Her shoulder remains painful in the posterior aspect but the pain comes and goes.  Symptoms are not constant.  She describes primarily posterior pain with some deltoid pain.  This pain actually started about 1 to 2 months after her surgery for frozen shoulder 3 years ago.  She does not feel like her symptoms are the same as they were for the frozen shoulder because she does have full motion now.  She describes some occasional mechanical symptoms.  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  She states that her neck feels tight at times but does not really report any radicular symptoms below the elbow on the right.  Most of her pain is focal.  She did throw the nerve football with her husband this weekend which aggravated the shoulder significantly.  She had a previous intra-articular injection which gave her marginal relief but the actual trigger point injection which caused her pneumothorax gave him relief of the shoulder pain not completely but significantly for 1 to 2 months.  MRI scan is reviewed today.  Rotator cuff intact.  No swelling of the inferior capsular recess.  Very small posterior superior labral tear which may be postsurgical or potentially degenerative in nature.  Does not appear to extend into the biceps anchor posteriorly.  Anterior inferior posterior inferior labrum intact.  Knee.                 ROS: All systems reviewed are negative as they relate to the chief complaint within the history of present illness.  Patient denies fevers or chills.  Assessment & Plan: Visit Diagnoses: No diagnosis found.  Plan: Impression is persistent right shoulder pain with underwhelming mechanical findings on MRI scan.  The labral pathology identified may or may not be clinically significant.  I think it is more likely based on her lack of response to intra-articular injection and positive response to trigger point injection despite the pneumothorax that she has more of a muscle problem outside of the joint.  Do not think that shoulder arthroscopy indicated at this time.  Would favor physical therapy to strengthen the periscapular muscles as well as rotator cuff muscles to see if that can help.  This was a successful strategy for her left hip.  Plan to see her back in 6 to 8 weeks if not improved.  Follow-Up Instructions: No follow-ups on file.   Orders:  No orders of the defined types were placed in this encounter.  No orders of the defined types were placed in this encounter.     Procedures: No procedures performed   Clinical Data: No additional findings.  Objective: Vital Signs: There were no vitals taken for this visit.  Physical Exam:  Constitutional: Patient appears  well-developed HEENT:  Head: Normocephalic Eyes:EOM are normal Neck: Normal range of motion Cardiovascular: Normal rate Pulmonary/chest: Effort normal Neurologic: Patient is alert Skin: Skin is warm Psychiatric: Patient has normal mood and affect  Ortho Exam: Ortho exam demonstrates good cervical spine range of motion.  She has excellent right and left shoulder range of motion with no restriction of passive external rotation.  Negative O'Brien's testing.  Negative speeds test on the right as well.  No discrete AC joint tenderness.  With labral load testing I cannot really elicit any mechanical symptoms.  Specialty  Comments:  No specialty comments available.  Imaging: No results found.   PMFS History: Patient Active Problem List   Diagnosis Date Noted   Pneumothorax 08/22/2021   History of DVT of lower extremity 05/17/2017   Raynaud's phenomenon without gangrene 05/17/2017   Anxiety 03/24/2015   Asthma 03/24/2015   De Quervain's tenosynovitis 03/24/2015   IBS (irritable bowel syndrome) 03/24/2015   Insomnia 03/24/2015   Major depression, recurrent ([place]) 03/24/2015   Past Medical History:  Diagnosis Date   Anemia    Anxiety    Asthma    De Quervain's disease (radial styloid tenosynovitis)    Depression    IBS (irritable bowel syndrome)    TMJ (dislocation of temporomandibular joint)     Family History  Problem Relation Age of Onset   Diabetes Father        several relatives on fathers side   Bladder Cancer Maternal Grandmother     No past surgical history on file. Social History   Occupational History   Occupation: Surveyor, minerals  Tobacco Use   Smoking status: Never   Smokeless tobacco: Never  Substance and Sexual Activity   Alcohol use: Yes    Alcohol/week: 1.0 standard drink of alcohol    Types: 1 Standard drinks or equivalent per week   Drug use: Not on file   Sexual activity: Not on file

## 2022-01-23 ENCOUNTER — Other Ambulatory Visit: Payer: Self-pay | Admitting: Internal Medicine

## 2022-01-23 DIAGNOSIS — Z1231 Encounter for screening mammogram for malignant neoplasm of breast: Secondary | ICD-10-CM

## 2022-03-05 ENCOUNTER — Encounter: Payer: Self-pay | Admitting: Orthopedic Surgery

## 2022-03-08 DIAGNOSIS — U071 COVID-19: Secondary | ICD-10-CM | POA: Diagnosis not present

## 2022-03-08 DIAGNOSIS — M791 Myalgia, unspecified site: Secondary | ICD-10-CM | POA: Diagnosis not present

## 2022-03-08 DIAGNOSIS — R509 Fever, unspecified: Secondary | ICD-10-CM | POA: Diagnosis not present

## 2022-03-08 DIAGNOSIS — Z789 Other specified health status: Secondary | ICD-10-CM | POA: Diagnosis not present

## 2022-03-08 DIAGNOSIS — J029 Acute pharyngitis, unspecified: Secondary | ICD-10-CM | POA: Diagnosis not present

## 2022-03-27 ENCOUNTER — Ambulatory Visit
Admission: RE | Admit: 2022-03-27 | Discharge: 2022-03-27 | Disposition: A | Discharge status: Home / Self Care | Payer: BC Managed Care – PPO | Source: Ambulatory Visit | Attending: Internal Medicine | Admitting: Internal Medicine

## 2022-03-27 DIAGNOSIS — Z1231 Encounter for screening mammogram for malignant neoplasm of breast: Secondary | ICD-10-CM

## 2022-03-28 IMAGING — MG MM DIGITAL SCREENING BILAT W/ TOMO AND CAD
8 series · 9 of 24 positions shown · non-contrast
Comparison: Previous exam(s).

CLINICAL DATA: Screening.

EXAM:
DIGITAL SCREENING BILATERAL MAMMOGRAM WITH TOMOSYNTHESIS AND CAD
TECHNIQUE: Bilateral screening digital craniocaudal and mediolateral oblique
mammograms were obtained. Bilateral screening digital breast
tomosynthesis was performed. The images were evaluated with
computer-aided detection.

[R MLO synth-2D]
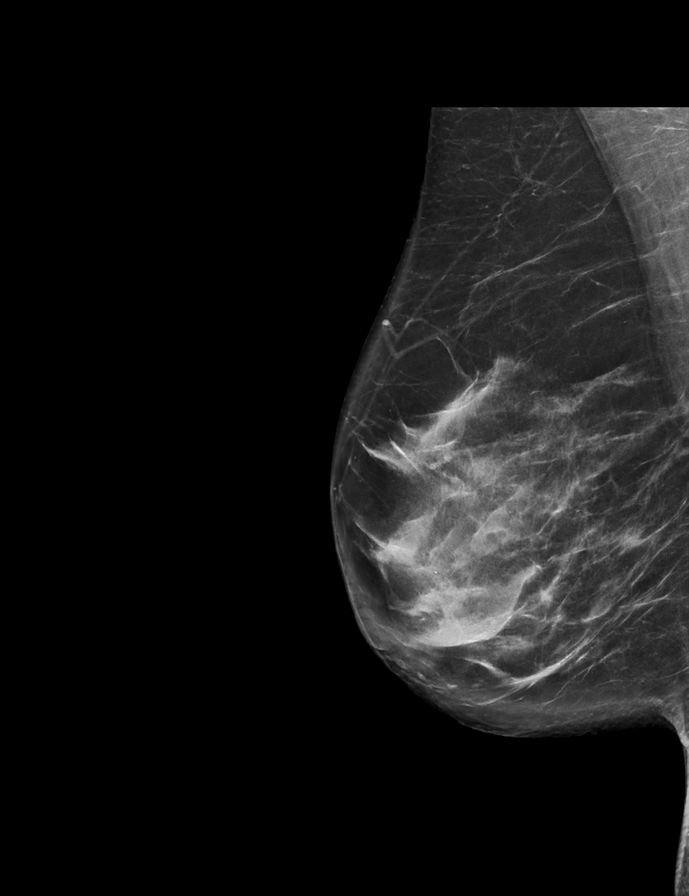

[R CC synth-2D]
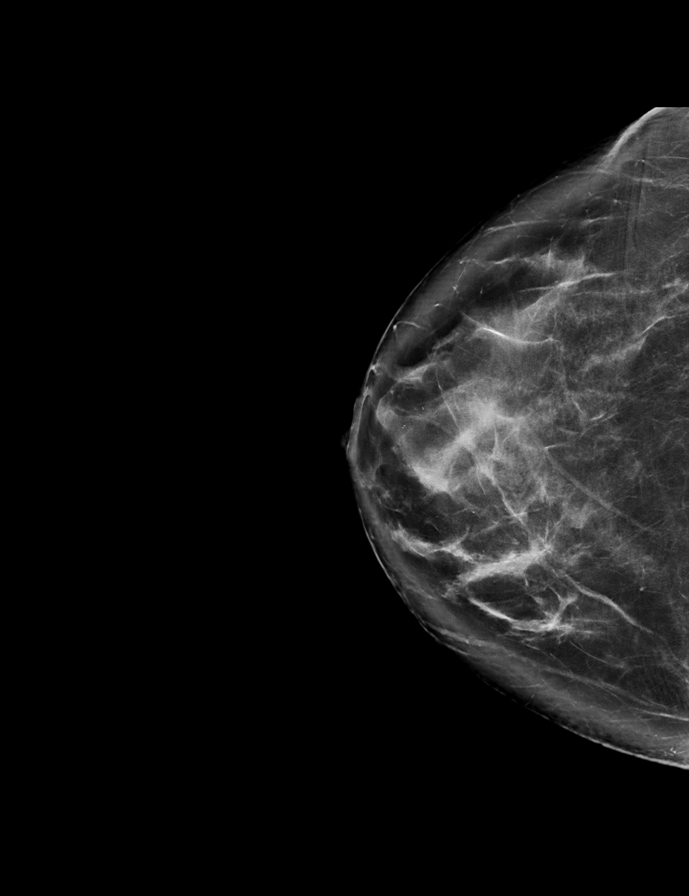

[L CC synth-2D]
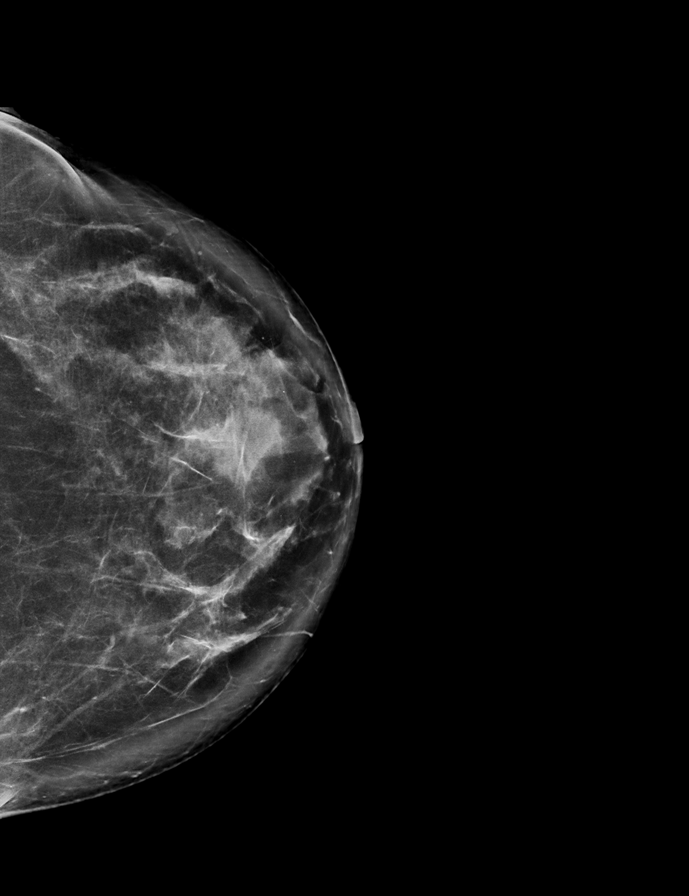

[L MLO synth-2D]
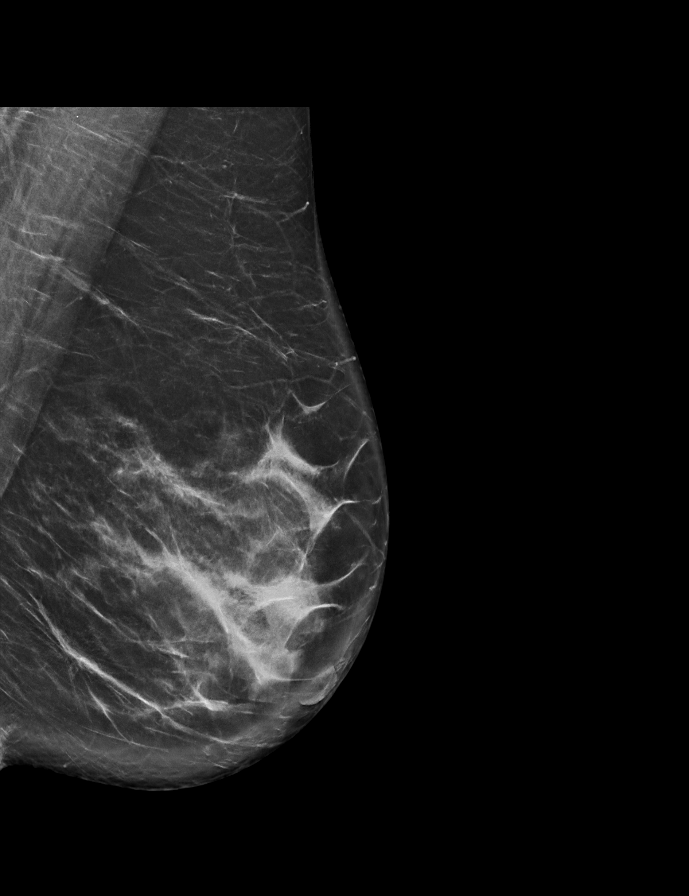

[L MLO tomo · 2 of 90 frames shown]
[frame 29/90]
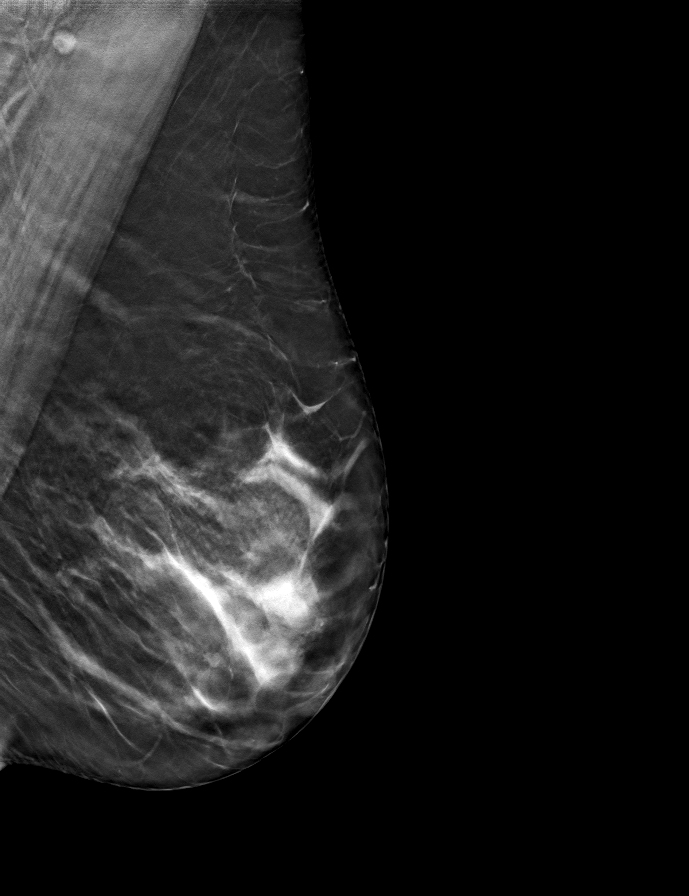
[frame 45/90]
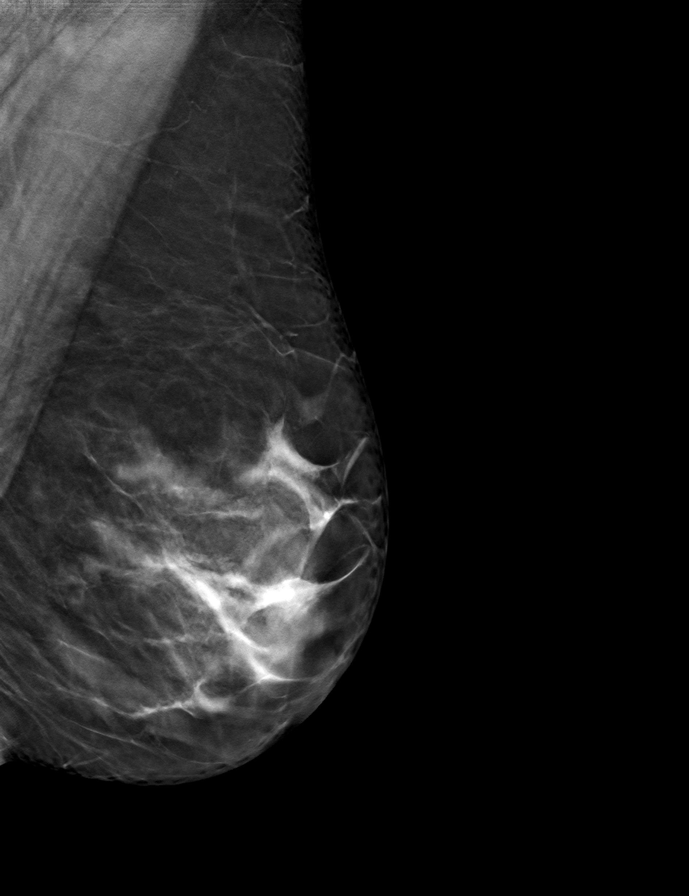

[L CC tomo · tomo slice 45/88.0]
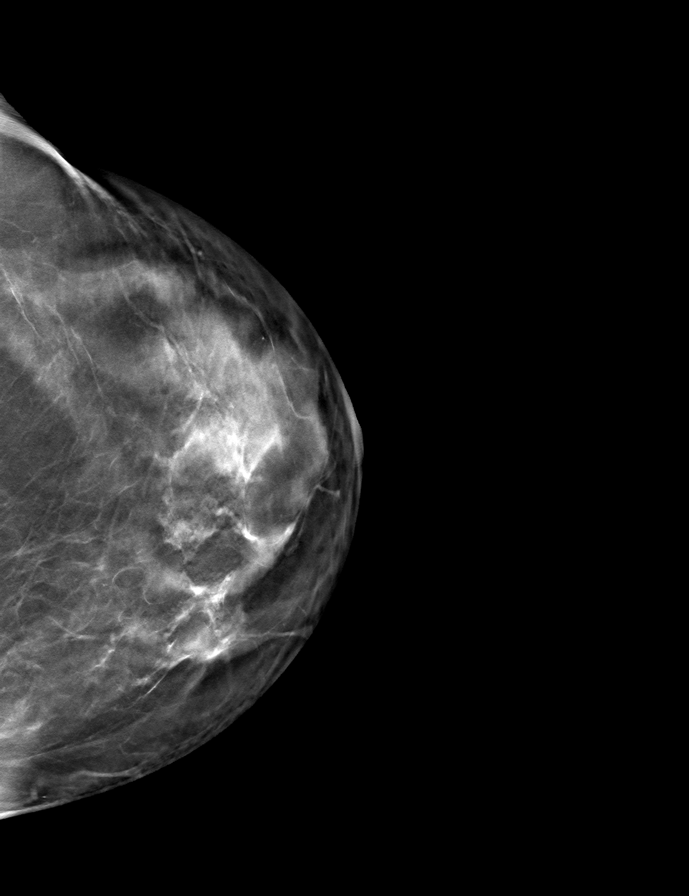

[R MLO tomo · tomo slice 43/86.0]
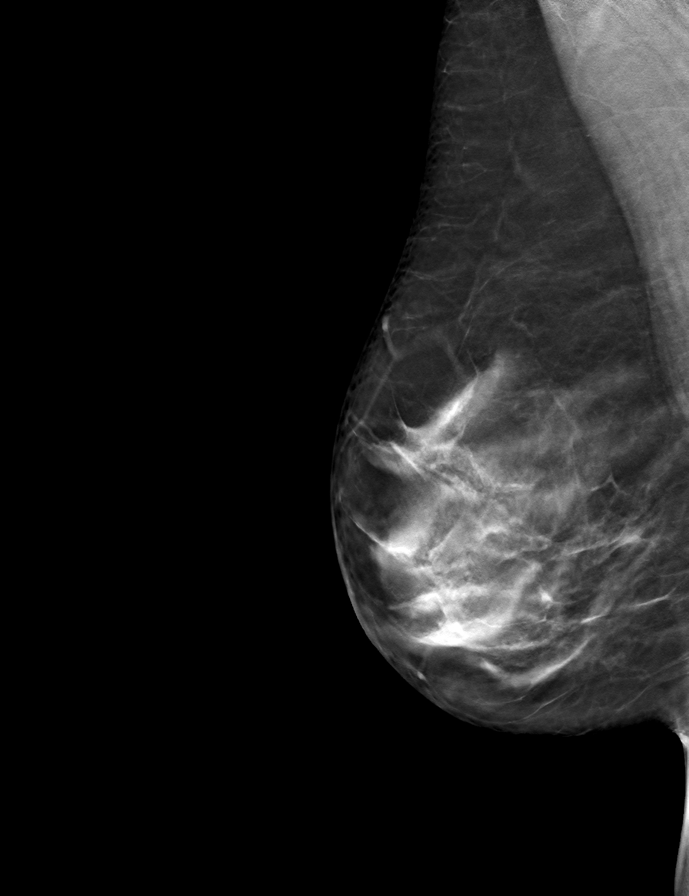

[R CC tomo · tomo slice 43/86.0]
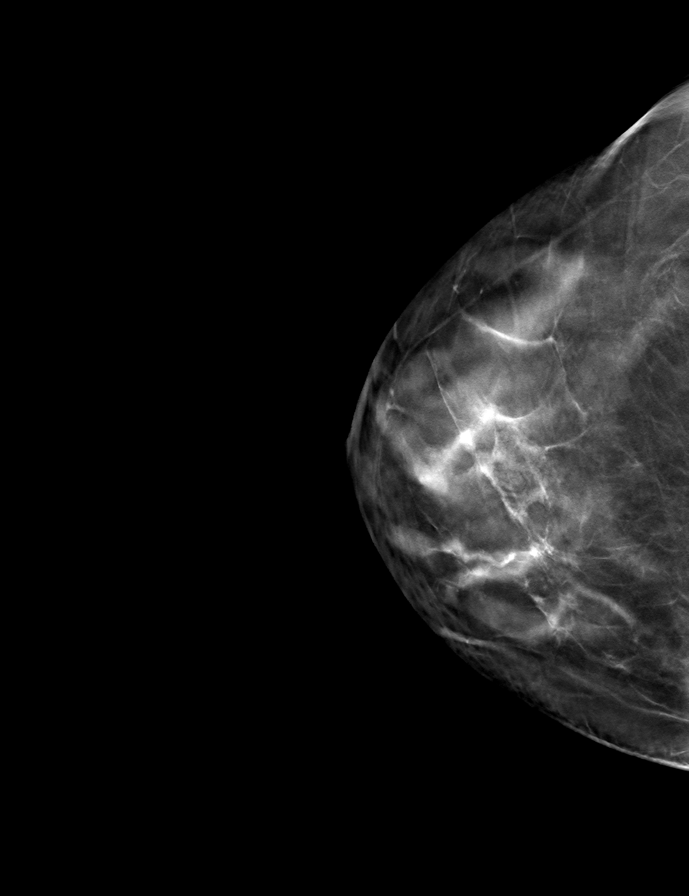

[9 of 24 positions shown; findings below may reference images not displayed]

ACR Breast Density Category c: The breast tissue is heterogeneously
dense, which may obscure small masses.
FINDINGS: In the right breast, a possible asymmetry in the slightly inner
lower breast at middle to posterior depth warrants further
evaluation (CC frame 69/86; MLO frame 56/86). In the left breast, no
findings suspicious for malignancy.
IMPRESSION: Further evaluation is suggested for possible asymmetry in the right
breast.

RECOMMENDATION:
Diagnostic mammogram and possibly ultrasound of the right breast.
(Code:SF-L-RRO)

The patient will be contacted regarding the findings, and additional
imaging will be scheduled.

BI-RADS CATEGORY  0: Incomplete. Need additional imaging evaluation
and/or prior mammograms for comparison.

## 2022-04-23 DIAGNOSIS — D2262 Melanocytic nevi of left upper limb, including shoulder: Secondary | ICD-10-CM | POA: Diagnosis not present

## 2022-04-23 DIAGNOSIS — L814 Other melanin hyperpigmentation: Secondary | ICD-10-CM | POA: Diagnosis not present

## 2022-04-23 DIAGNOSIS — D2261 Melanocytic nevi of right upper limb, including shoulder: Secondary | ICD-10-CM | POA: Diagnosis not present

## 2022-04-23 DIAGNOSIS — D225 Melanocytic nevi of trunk: Secondary | ICD-10-CM | POA: Diagnosis not present

## 2022-06-11 DIAGNOSIS — Z01419 Encounter for gynecological examination (general) (routine) without abnormal findings: Secondary | ICD-10-CM | POA: Diagnosis not present

## 2022-06-11 DIAGNOSIS — Z124 Encounter for screening for malignant neoplasm of cervix: Secondary | ICD-10-CM | POA: Diagnosis not present

## 2022-11-03 DIAGNOSIS — E785 Hyperlipidemia, unspecified: Secondary | ICD-10-CM | POA: Diagnosis not present

## 2022-11-03 DIAGNOSIS — Z0000 Encounter for general adult medical examination without abnormal findings: Secondary | ICD-10-CM | POA: Diagnosis not present

## 2022-11-03 DIAGNOSIS — E559 Vitamin D deficiency, unspecified: Secondary | ICD-10-CM | POA: Diagnosis not present

## 2022-11-03 DIAGNOSIS — F418 Other specified anxiety disorders: Secondary | ICD-10-CM | POA: Diagnosis not present

## 2022-11-03 DIAGNOSIS — G4489 Other headache syndrome: Secondary | ICD-10-CM | POA: Diagnosis not present

## 2022-11-03 DIAGNOSIS — Z862 Personal history of diseases of the blood and blood-forming organs and certain disorders involving the immune mechanism: Secondary | ICD-10-CM | POA: Diagnosis not present

## 2022-12-30 DIAGNOSIS — K5900 Constipation, unspecified: Secondary | ICD-10-CM | POA: Diagnosis not present

## 2023-02-05 DIAGNOSIS — D123 Benign neoplasm of transverse colon: Secondary | ICD-10-CM | POA: Diagnosis not present

## 2023-02-05 DIAGNOSIS — Z1211 Encounter for screening for malignant neoplasm of colon: Secondary | ICD-10-CM | POA: Diagnosis not present

## 2023-02-05 DIAGNOSIS — K648 Other hemorrhoids: Secondary | ICD-10-CM | POA: Diagnosis not present

## 2023-02-05 DIAGNOSIS — D120 Benign neoplasm of cecum: Secondary | ICD-10-CM | POA: Diagnosis not present

## 2023-02-15 ENCOUNTER — Other Ambulatory Visit: Payer: Self-pay | Admitting: Internal Medicine

## 2023-02-15 DIAGNOSIS — Z1231 Encounter for screening mammogram for malignant neoplasm of breast: Secondary | ICD-10-CM

## 2023-04-02 ENCOUNTER — Ambulatory Visit
Admission: RE | Admit: 2023-04-02 | Discharge: 2023-04-02 | Disposition: A | Discharge status: Home / Self Care | Payer: BC Managed Care – PPO | Source: Ambulatory Visit | Attending: Internal Medicine | Admitting: Internal Medicine

## 2023-04-02 DIAGNOSIS — Z1231 Encounter for screening mammogram for malignant neoplasm of breast: Secondary | ICD-10-CM | POA: Diagnosis not present

## 2023-05-25 DIAGNOSIS — D2272 Melanocytic nevi of left lower limb, including hip: Secondary | ICD-10-CM | POA: Diagnosis not present

## 2023-05-25 DIAGNOSIS — L718 Other rosacea: Secondary | ICD-10-CM | POA: Diagnosis not present

## 2023-05-25 DIAGNOSIS — D225 Melanocytic nevi of trunk: Secondary | ICD-10-CM | POA: Diagnosis not present

## 2023-05-25 DIAGNOSIS — L858 Other specified epidermal thickening: Secondary | ICD-10-CM | POA: Diagnosis not present

## 2023-06-25 DIAGNOSIS — Z01419 Encounter for gynecological examination (general) (routine) without abnormal findings: Secondary | ICD-10-CM | POA: Diagnosis not present

## 2023-06-25 DIAGNOSIS — Z23 Encounter for immunization: Secondary | ICD-10-CM | POA: Diagnosis not present

## 2023-08-26 DIAGNOSIS — S01331A Puncture wound without foreign body of right ear, initial encounter: Secondary | ICD-10-CM | POA: Diagnosis not present

## 2023-09-03 DIAGNOSIS — Z23 Encounter for immunization: Secondary | ICD-10-CM | POA: Diagnosis not present

## 2023-09-16 DIAGNOSIS — L218 Other seborrheic dermatitis: Secondary | ICD-10-CM | POA: Diagnosis not present

## 2023-09-23 DIAGNOSIS — Z23 Encounter for immunization: Secondary | ICD-10-CM | POA: Diagnosis not present

## 2023-10-11 DIAGNOSIS — R220 Localized swelling, mass and lump, head: Secondary | ICD-10-CM | POA: Diagnosis not present

## 2023-10-11 DIAGNOSIS — L0390 Cellulitis, unspecified: Secondary | ICD-10-CM | POA: Diagnosis not present

## 2023-11-09 DIAGNOSIS — R21 Rash and other nonspecific skin eruption: Secondary | ICD-10-CM | POA: Diagnosis not present

## 2023-11-09 DIAGNOSIS — M2550 Pain in unspecified joint: Secondary | ICD-10-CM | POA: Diagnosis not present

## 2023-11-09 DIAGNOSIS — E559 Vitamin D deficiency, unspecified: Secondary | ICD-10-CM | POA: Diagnosis not present

## 2023-11-09 DIAGNOSIS — F418 Other specified anxiety disorders: Secondary | ICD-10-CM | POA: Diagnosis not present

## 2023-11-09 DIAGNOSIS — Z0000 Encounter for general adult medical examination without abnormal findings: Secondary | ICD-10-CM | POA: Diagnosis not present

## 2023-11-09 DIAGNOSIS — Z862 Personal history of diseases of the blood and blood-forming organs and certain disorders involving the immune mechanism: Secondary | ICD-10-CM | POA: Diagnosis not present

## 2023-11-09 DIAGNOSIS — G4489 Other headache syndrome: Secondary | ICD-10-CM | POA: Diagnosis not present

## 2023-11-09 DIAGNOSIS — I7300 Raynaud's syndrome without gangrene: Secondary | ICD-10-CM | POA: Diagnosis not present

## 2023-11-09 DIAGNOSIS — N3010 Interstitial cystitis (chronic) without hematuria: Secondary | ICD-10-CM | POA: Diagnosis not present

## 2023-11-09 DIAGNOSIS — E785 Hyperlipidemia, unspecified: Secondary | ICD-10-CM | POA: Diagnosis not present

## 2023-12-02 DIAGNOSIS — R399 Unspecified symptoms and signs involving the genitourinary system: Secondary | ICD-10-CM | POA: Diagnosis not present
